# Patient Record
Sex: Female | Born: 1990
Health system: Southern US, Community
[De-identification: ages and names within clinical notes are randomized; demographics above are authoritative.]

## PROBLEM LIST (undated history)

## (undated) DIAGNOSIS — F32A Depression, unspecified: Secondary | ICD-10-CM

## (undated) DIAGNOSIS — G43909 Migraine, unspecified, not intractable, without status migrainosus: Secondary | ICD-10-CM

## (undated) DIAGNOSIS — F329 Major depressive disorder, single episode, unspecified: Secondary | ICD-10-CM

## (undated) DIAGNOSIS — E669 Obesity, unspecified: Secondary | ICD-10-CM

## (undated) DIAGNOSIS — N200 Calculus of kidney: Secondary | ICD-10-CM

## (undated) HISTORY — PX: OTHER SURGICAL HISTORY: SHX169

## (undated) HISTORY — PX: TONSILLECTOMY AND ADENOIDECTOMY: SHX28

## (undated) HISTORY — DX: Migraine, unspecified, not intractable, without status migrainosus: G43.909

## (undated) HISTORY — DX: Major depressive disorder, single episode, unspecified: F32.9

## (undated) HISTORY — DX: Calculus of kidney: N20.0

## (undated) HISTORY — DX: Obesity, unspecified: E66.9

## (undated) HISTORY — DX: Depression, unspecified: F32.A

## (undated) HISTORY — PX: WRIST SURGERY: SHX841

---
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
## 2010-02-04 IMAGING — CT CT ABD-PELV W/ CM
1 of 2 series · 15 of 32 positions shown, 19 images · non-contrast
Comparison: none

REASON FOR EXAM: (1) pain; (2) pain
COMMENTS:

[Series 2: appendicitis · axial · 0.82mm/px · z∈[-486,-30]mm · 15 of 166 slices shown, 19 images]
[im 7/166  soft-tissue]
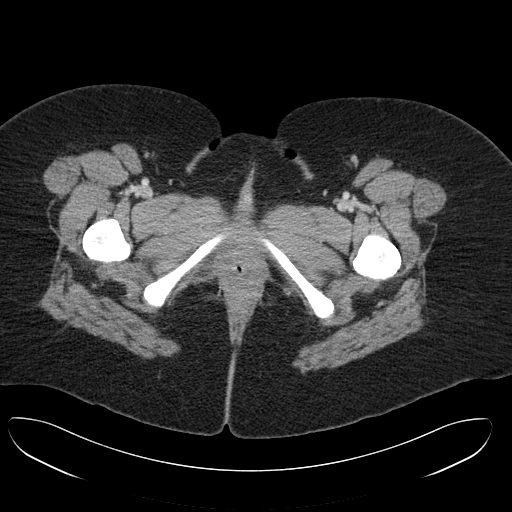
[im 7/166  bone]
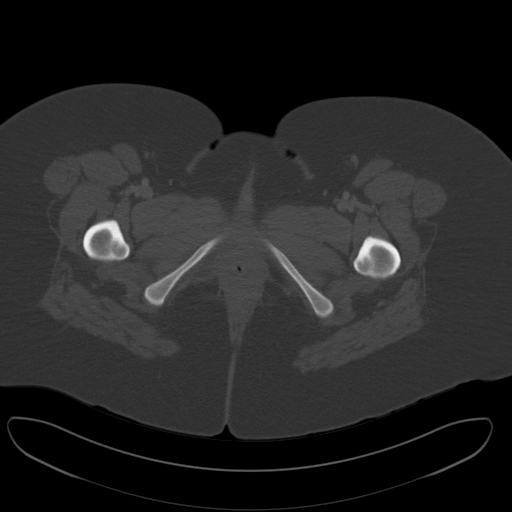
[im 20/166  soft-tissue]
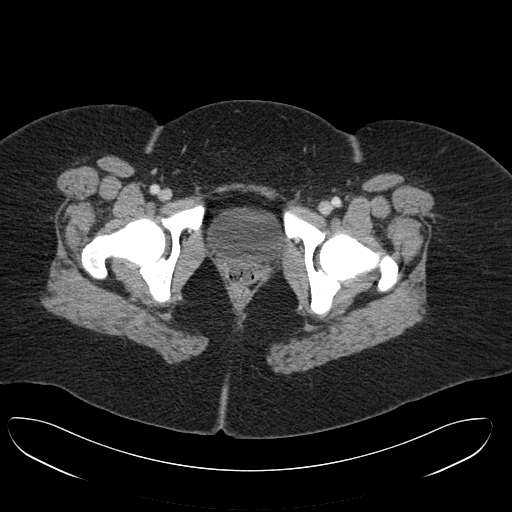
[im 32/166  soft-tissue]
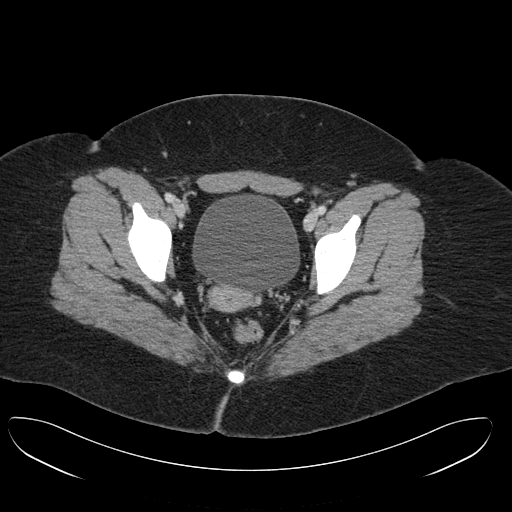
[im 45/166  soft-tissue]
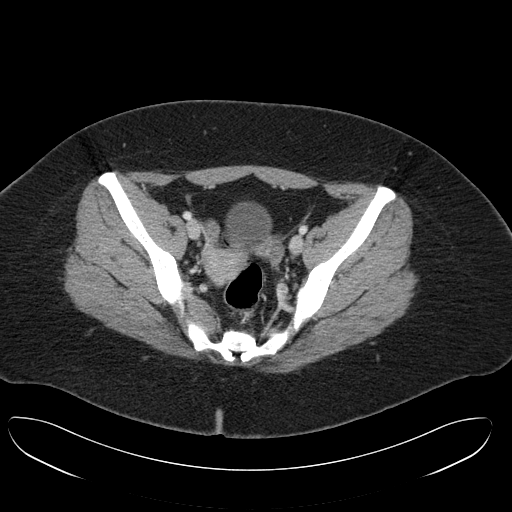
[im 58/166  soft-tissue]
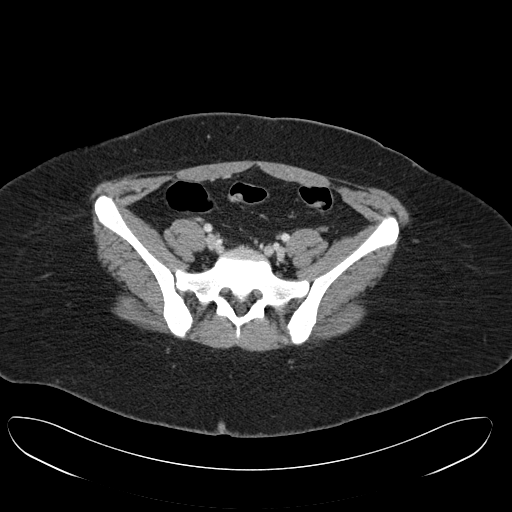
[im 70/166  soft-tissue]
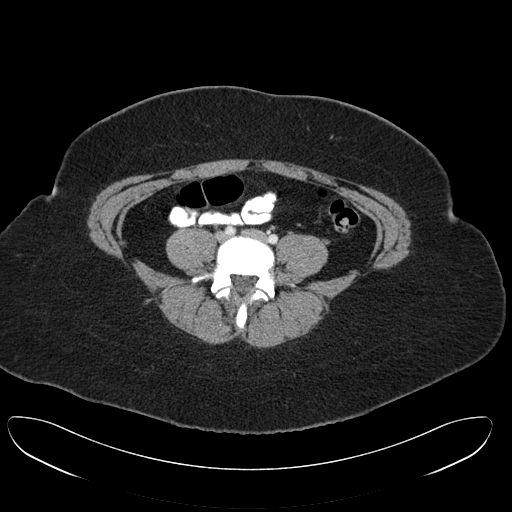
[im 83/166  soft-tissue]
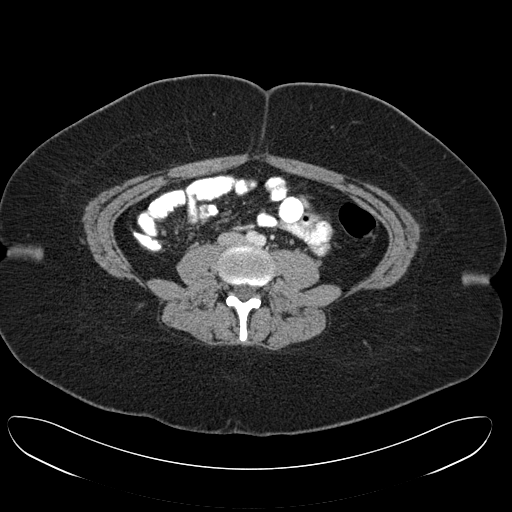
[im 96/166  soft-tissue]
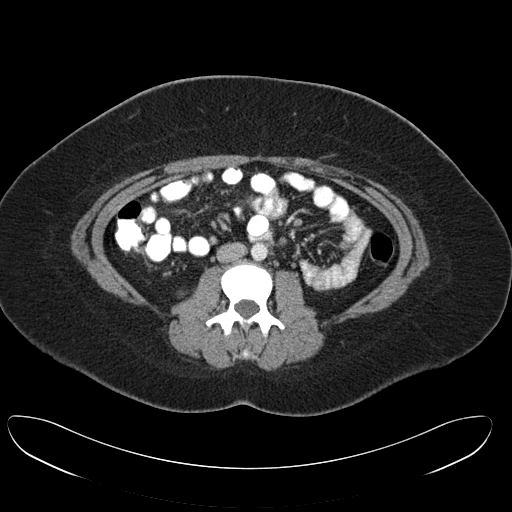
[im 108/166  soft-tissue]
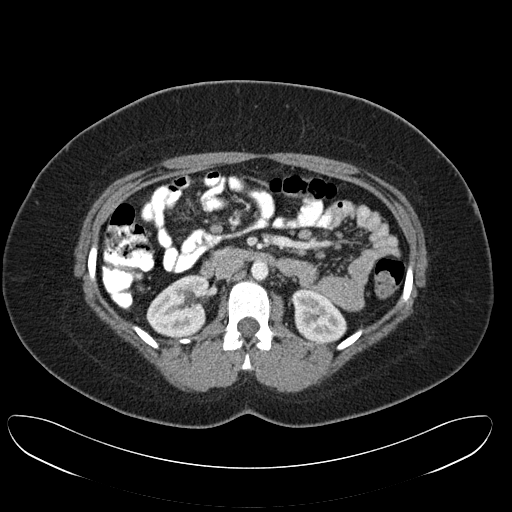
[im 108/166  bone]
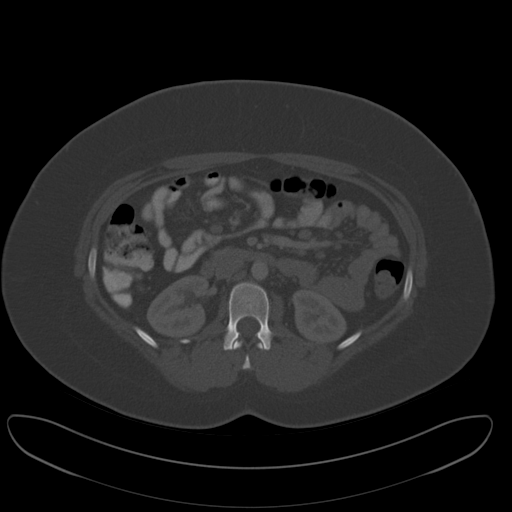
[im 121/166  soft-tissue]
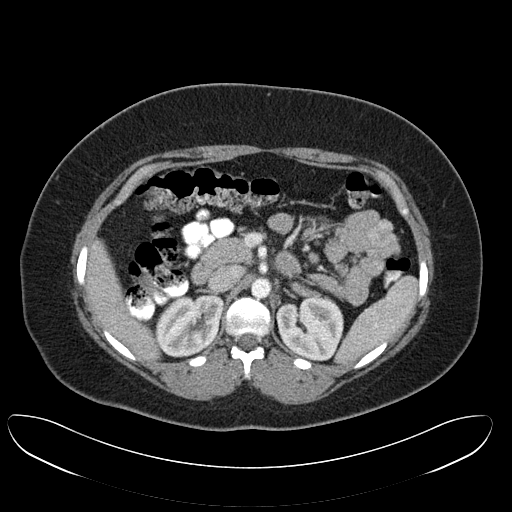
[im 134/166  soft-tissue]
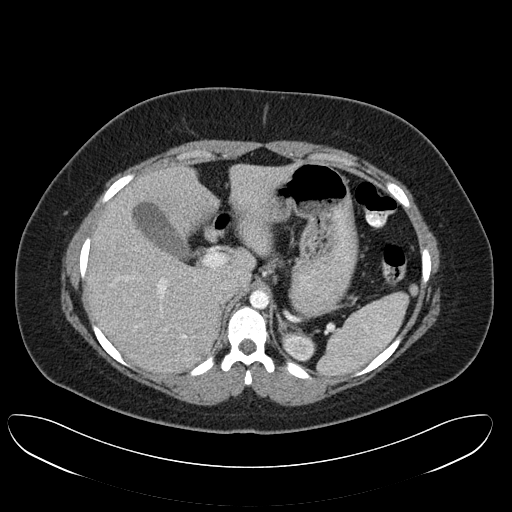
[im 140/166  lung]
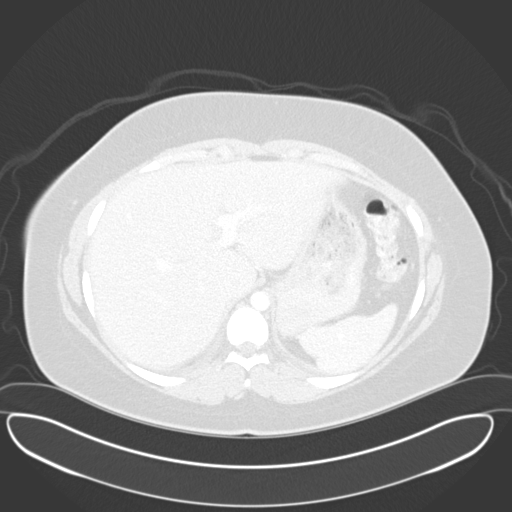
[im 146/166  soft-tissue]
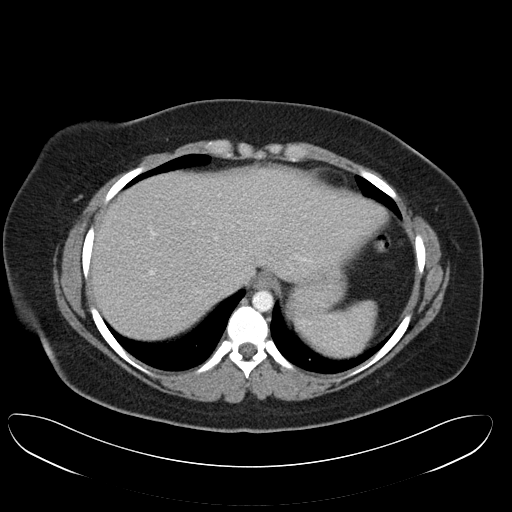
[im 146/166  lung]
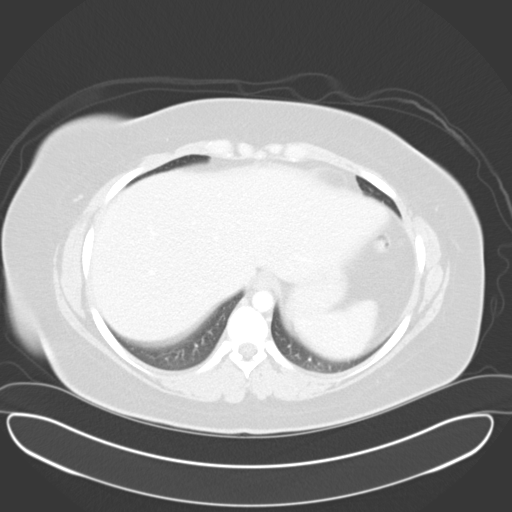
[im 153/166  lung]
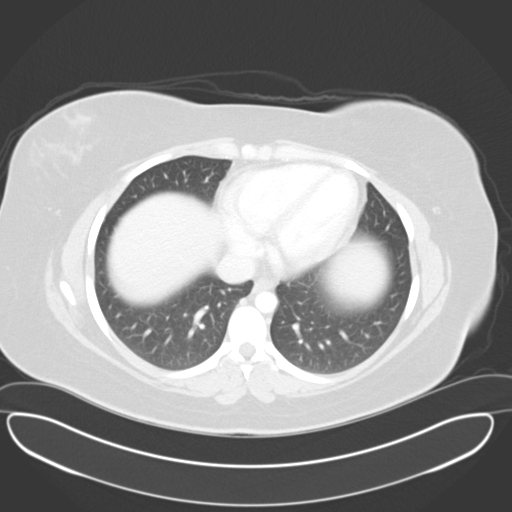
[im 159/166  soft-tissue]
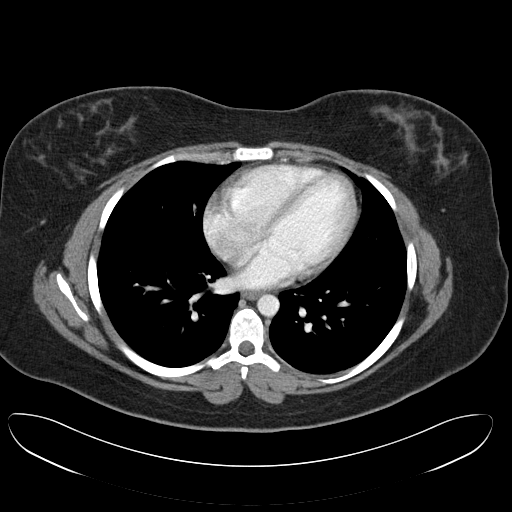
[im 159/166  lung]
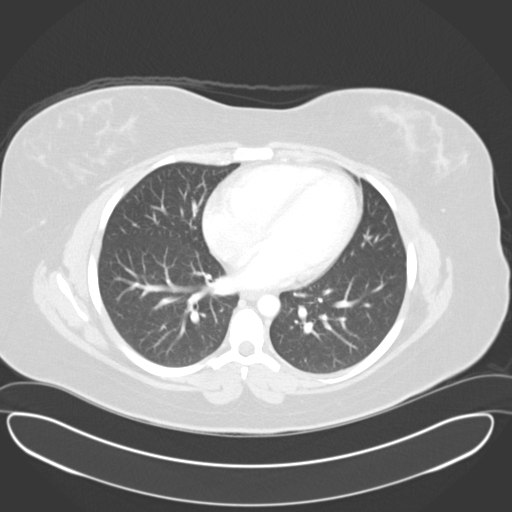

[15 of 32 positions shown; findings below may reference images not displayed]

PROCEDURE:     CT  - CT ABDOMEN / PELVIS  W  - May 12, 2008 [DATE]

RESULT:     Axial contrast enhanced CT scanning was performed through the
abdomen and pelvis. The patient received 100 cc of Jsovue-FC1 as well as
oral contrast material. Review of 3-dimensional reconstructed images was
performed separately on the [employer] monitor.

The liver exhibits normal density with no mass nor ductal dilation. The
gallbladder is adequately distended with no evidence of stones. The
pancreas, spleen, stomach, adrenal glands, and kidneys exhibit no acute
abnormality. The caliber of the abdominal aorta is normal.

The partially contrast-filled loops of small and large bowel are normal in
appearance. I see no findings suspicious for acute appendicitis or acute
diverticulitis. There are numerous in borderline enlarged mesenteric lymph
nodes present throughout the mesentery. Within the pelvis the uterus and
adnexal structures are within the limits of normal. The urinary bladder is
partially distended and grossly normal. No free fluid is identified. No
pelvic or inguinal lymphadenopathy is identified. The lung bases are clear.
The lumbar vertebral bodies are preserved in height.
IMPRESSION: 1. I do not see evidence of acute appendicitis or other acute bowel
abnormality.
2. There are numerous borderline to mildly enlarged mesenteric lymph nodes.
This may indicate mesenteric adenitis. No retroperitoneal lymphadenopathy is
identified.
3. There is no evidence of ascites.
4. I see no acute abnormality of the hepatobiliary tree nor of the urinary
tracts.

A preliminary report was sent to the [HOSPITAL] the conclusion
of the study the

## 2010-05-25 LAB — BASIC METABOLIC PANEL
BUN: 8 mg/dL (ref 6–23)
Chloride: 108 mEq/L (ref 96–112)
GFR calc non Af Amer: >60 mL/min (ref >60)
Glucose, Bld: 98 mg/dL (ref 70–99)
Potassium: 3.9 mEq/L (ref 3.5–5.1)
Sodium: 139 mEq/L (ref 135–145)

## 2010-05-25 LAB — DIFFERENTIAL
Eosinophils Absolute: 0.1 10*3/uL (ref 0.0–0.7)
Eosinophils Relative: 2 % (ref 0–5)
Lymphocytes Relative: 29 % (ref 12–46)
Lymphs Abs: 1.8 10*3/uL (ref 0.7–4.0)
Monocytes Absolute: 0.6 10*3/uL (ref 0.1–1.0)

## 2010-05-25 LAB — URINALYSIS, ROUTINE W REFLEX MICROSCOPIC
Glucose, UA: NEGATIVE mg/dL
Hgb urine dipstick: NEGATIVE
Ketones, ur: NEGATIVE mg/dL
Protein, ur: NEGATIVE mg/dL
pH: 7 (ref 5.0–8.0)

## 2010-05-25 LAB — WET PREP, GENITAL
WBC, Wet Prep HPF POC: NONE SEEN
Yeast Wet Prep HPF POC: NONE SEEN

## 2010-05-25 LAB — CBC
HCT: 43.1 % (ref 36.0–46.0)
Hemoglobin: 14.7 g/dL (ref 12.0–15.0)
MCV: 87.7 fL (ref 78.0–100.0)
Platelets: 265 10*3/uL (ref 150–400)
WBC: 6.2 10*3/uL (ref 4.0–10.5)

## 2010-05-25 LAB — GC/CHLAMYDIA PROBE AMP, GENITAL
Chlamydia, DNA Probe: NEGATIVE
GC Probe Amp, Genital: NEGATIVE

## 2010-06-24 ENCOUNTER — Emergency Department: Payer: Self-pay | Admitting: Emergency Medicine

## 2010-06-28 LAB — PATHOLOGY REPORT
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
## 2011-03-21 ENCOUNTER — Emergency Department: Payer: Self-pay | Admitting: Emergency Medicine

## 2011-11-07 ENCOUNTER — Observation Stay: Payer: Self-pay

## 2011-12-01 ENCOUNTER — Inpatient Hospital Stay: Payer: Self-pay

## 2011-12-01 ENCOUNTER — Observation Stay: Payer: Self-pay

## 2011-12-01 LAB — CBC WITH DIFFERENTIAL/PLATELET
Basophil %: 0.4 %
Eosinophil %: 0.3 %
HCT: 38.5 % (ref 35.0–47.0)
Lymphocyte #: 2.4 10*3/uL (ref 1.0–3.6)
MCV: 92 fL (ref 80–100)
Monocyte %: 4 %
Neutrophil #: 13.2 10*3/uL — ABNORMAL HIGH (ref 1.4–6.5)
RDW: 13.8 % (ref 11.5–14.5)
WBC: 16.4 10*3/uL — ABNORMAL HIGH (ref 3.6–11.0)

## 2011-12-03 LAB — HEMATOCRIT: HCT: 33.9 % — ABNORMAL LOW (ref 35.0–47.0)

## 2012-07-17 LAB — HM PAP SMEAR: HM PAP: NEGATIVE

## 2012-11-30 ENCOUNTER — Encounter (HOSPITAL_BASED_OUTPATIENT_CLINIC_OR_DEPARTMENT_OTHER): Payer: Self-pay | Admitting: Emergency Medicine

## 2012-11-30 ENCOUNTER — Emergency Department (HOSPITAL_BASED_OUTPATIENT_CLINIC_OR_DEPARTMENT_OTHER): Payer: 59

## 2012-11-30 ENCOUNTER — Emergency Department (HOSPITAL_BASED_OUTPATIENT_CLINIC_OR_DEPARTMENT_OTHER)
Admission: EM | Admit: 2012-11-30 | Discharge: 2012-11-30 | Disposition: A | Discharge status: Home / Self Care | Payer: 59 | Attending: Emergency Medicine | Admitting: Emergency Medicine

## 2012-11-30 DIAGNOSIS — X500XXA Overexertion from strenuous movement or load, initial encounter: Secondary | ICD-10-CM | POA: Insufficient documentation

## 2012-11-30 DIAGNOSIS — IMO0001 Reserved for inherently not codable concepts without codable children: Secondary | ICD-10-CM | POA: Insufficient documentation

## 2012-11-30 DIAGNOSIS — S93402A Sprain of unspecified ligament of left ankle, initial encounter: Secondary | ICD-10-CM

## 2012-11-30 DIAGNOSIS — Y9289 Other specified places as the place of occurrence of the external cause: Secondary | ICD-10-CM | POA: Insufficient documentation

## 2012-11-30 DIAGNOSIS — Y93E5 Activity, floor mopping and cleaning: Secondary | ICD-10-CM | POA: Insufficient documentation

## 2012-11-30 DIAGNOSIS — S93409A Sprain of unspecified ligament of unspecified ankle, initial encounter: Secondary | ICD-10-CM | POA: Insufficient documentation

## 2012-11-30 MED ORDER — IBUPROFEN 800 MG PO TABS: 800.0000 mg | ORAL_TABLET | Freq: Three times a day (TID) | ORAL | Status: DC

## 2012-11-30 NOTE — ED Notes (Signed)
Pt reports that she was cleaning up from a birthday party, stepped on a toy and twisted ankle,

## 2012-11-30 NOTE — ED Provider Notes (Signed)
Medical screening examination/treatment/procedure(s) were performed by non-physician practitioner and as supervising physician I was immediately available for consultation/collaboration.  Doug Sou, MD 11/30/12 [phone]

## 2012-11-30 NOTE — ED Provider Notes (Signed)
CSN: [id number]     Arrival date & time 11/30/12  2210 History   None    Chief Complaint  Patient presents with  . Ankle Injury   (Consider location/radiation/quality/duration/timing/severity/associated sxs/prior Treatment) Patient is a 22 y.o. female presenting with lower extremity injury. The history is provided by the patient. No language interpreter was used.  Ankle Injury This is a new problem. The current episode started today. The problem occurs constantly. The problem has been unchanged. Associated symptoms include myalgias. Nothing aggravates the symptoms. She has tried nothing for the symptoms. The treatment provided no relief.  Pt reports she twisted right ankle  History reviewed. No pertinent past medical history. History reviewed. No pertinent past surgical history. History reviewed. No pertinent family history. History  Substance Use Topics  . Smoking status: Never Smoker   . Smokeless tobacco: Never Used  . Alcohol Use: No   OB History   Grav Para Term Preterm Abortions TAB SAB Ect Mult Living                 Review of Systems  Musculoskeletal: Positive for myalgias.  All other systems reviewed and are negative.    Allergies  Review of patient's allergies indicates no known allergies.  Home Medications  No current outpatient prescriptions on file. BP 112/73  Pulse 91  Temp(Src) 98.1 F (36.7 C) (Oral)  Resp 18  Ht 5' 3.5" (1.613 m)  Wt 270 lb (122.471 kg)  BMI 47.07 kg/m2  SpO2 100%  LMP 11/23/2012 Physical Exam  Constitutional: She is oriented to person, place, and time. She appears well-developed and well-nourished.  HENT:  Head: Normocephalic.  Eyes: Pupils are equal, round, and reactive to light.  Musculoskeletal: She exhibits tenderness.  Swollen right foot and ankle  Neurological: She is alert and oriented to person, place, and time.  Skin: Skin is warm.  Psychiatric: She has a normal mood and affect.    ED Course  Procedures  (including critical care time) Labs Review Labs Reviewed - No data to display Imaging Review Dg Ankle Complete Right  11/30/2012   CLINICAL DATA:  Lateral foot and ankle pain after twisting injury.  EXAM: RIGHT ANKLE - COMPLETE 3+ VIEW  COMPARISON:  None.  FINDINGS: There is no evidence of fracture, dislocation, or joint effusion. There is no evidence of arthropathy or other focal bone abnormality. Soft tissues are unremarkable.  IMPRESSION: Negative.   Electronically Signed   By: Burman Nieves M.D.   On: 11/30/2012 23:03   Dg Foot Complete Right  11/30/2012   CLINICAL DATA:  Lateral foot and ankle pain after twisting injury.  EXAM: RIGHT FOOT COMPLETE - 3+ VIEW  COMPARISON:  None.  FINDINGS: There is no evidence of fracture or dislocation. There is no evidence of arthropathy or other focal bone abnormality. Soft tissues are unremarkable.  IMPRESSION: Negative.   Electronically Signed   By: Burman Nieves M.D.   On: 11/30/2012 23:03    EKG Interpretation   None       MDM   1. Ankle sprain, left, initial encounter    Aso, ibuprofen,  Follow up with Dr. Allena Napoleon, PA-C 11/30/12 2319  Lonia Skinner Colon, PA-C 11/30/12 2320  Lonia Skinner [place], [place] 11/30/12 2320

## 2014-06-22 NOTE — H&P (Signed)
L&D Evaluation:  History:   HPI 24 yo [facility]2P0 [facility] presents to L&D for contractions starting yesterday morning, icreasing in frequency, and keeping her awake overnight prior to presentation.  [facility] and [facility]. Fetus with small choroid plexus cyst resolved on follow up scan.  Most recent US based EFW at 38 weeks 3364g 60%ile, AFI 20cm.    Presents with contractions    Patient's Medical History hx depression    Patient's Surgical History tonsillectomy    Medications Pre Natal Vitamins  Zoloft 100mg  po daily, ASA 81mg  daily    Allergies NKDA    Social History none    Family History Non-Contributory   ROS:   ROS All systems were reviewed.  HEENT, CNS, GI, GU, Respiratory, CV, Renal and Musculoskeletal systems were found to be normal.   Exam:   Vital Signs stable    Urine Protein not completed    General no apparent distress    Mental Status clear    Abdomen gravid, non-tender    Estimated Fetal Weight Average for gestational age    Back no CVAT    Edema no edema    Pelvic 50/2/-3    Mebranes Intact    FHT normal rate with no decels    Ucx regular, 4-686min   Impression:   Impression early labor   Plan:   Plan EFM/NST, monitor contractions and for cervical change    Comments - Will give patient prolonged rule out recheck in 4-hr after presentation to see if any cervical change   Electronic Signatures for Addendum Section:  Lorrene ReidStaebler, Porsche Noguchi M (MD) (Signed Addendum 19-Oct-13 11:37)  Cervix unchanged 2/50/-2 anterior and soft.  Rx written for ambien for tongiht, routine labor precautions.  Out of social reasons will consider IOL tomorrow when I am on if L&D is not to busy, otherwise put on for 12/06/11   Electronic Signatures: Lorrene ReidStaebler, Herberto Ledwell M (MD)  (Signed 19-Oct-13 11:12)  Authored: L&D Evaluation   Last Updated: 19-Oct-13 11:37 by Lorrene ReidStaebler, Debhora Titus M (MD)

## 2014-06-22 NOTE — H&P (Signed)
L&D Evaluation:  History:   HPI 24 yo G2P0 presents to L&D for decreased FM at 36 weeks. [facility]. No signifant events during pregnancy.    Presents with decreased fetal movement    Patient's Medical History hx depression    Patient's Surgical History tonsillectomy    Medications Pre Natal Vitamins    Allergies NKDA    Social History none    Family History Non-Contributory   ROS:   ROS All systems were reviewed.  HEENT, CNS, GI, GU, Respiratory, CV, Renal and Musculoskeletal systems were found to be normal.   Exam:   Vital Signs stable    Urine Protein not completed    General no apparent distress    Mental Status clear    Abdomen gravid, non-tender    Estimated Fetal Weight Average for gestational age    Back no CVAT    Edema no edema    Mebranes Intact    FHT normal rate with no decels    Ucx irritabiity   Impression:   Impression decreased fetal movement, reactive NST   Plan:   Plan EFM/NST, discharge    Comments good FM noted here, pt feeling now. has appt at office in 5 days   Electronic Signatures: Shella MaximPutnam, Glenette Bookwalter (CNM)  (Signed 25-Sep-13 21:03)  Authored: L&D Evaluation   Last Updated: 25-Sep-13 21:03 by Shella MaximPutnam, Chirstine Defrain (CNM)

## 2014-06-22 NOTE — H&P (Signed)
L&D Evaluation:  History:   HPI 24 yo G2P0 [facility]8wk [facility]) presents to L&D second time today for contractions starting yesterday morning, icreasing in frequency, and keeping her awake overnight prior to presentation.  [facility] and [facility]. Fetus with small choroid plexus cyst resolved on follow up scan.  Most recent US based EFW at 38 weeks 3364g 60%ile, AFI 20cm.  During the time of her second rule out the patient underwent gross SROM with meconium stained fluid noted.    Presents with contractions    Patient's Medical History hx depression, morbid obesity    Patient's Surgical History tonsillectomy    Medications Pre Natal Vitamins  Zoloft 100mg  po daily, ASA 81mg  daily    Allergies NKDA    Social History none    Family History Non-Contributory   ROS:   ROS All systems were reviewed.  HEENT, CNS, GI, GU, Respiratory, CV, Renal and Musculoskeletal systems were found to be normal.   Exam:   Vital Signs stable    Urine Protein not completed    General no apparent distress    Mental Status clear    Abdomen gravid, non-tender    Estimated Fetal Weight Average for gestational age    Back no CVAT    Edema no edema    Pelvic 4/C/-2    Mebranes Ruptured    Description green/meconium    FHT normal rate with no decels    Ucx regular, 4-226min   Impression:   Impression active labor, 7130w3d early labor, SROM   Plan:   Plan EFM/NST, monitor contractions and for cervical change    Comments - GBS negative - Admit for term labor/SROM   Electronic Signatures: Lorrene ReidStaebler, Lama Narayanan M (MD)  (Signed 19-Oct-13 22:13)  Authored: L&D Evaluation   Last Updated: 19-Oct-13 22:13 by Lorrene ReidStaebler, Nabilah Davoli M (MD)

## 2015-06-08 ENCOUNTER — Observation Stay (HOSPITAL_COMMUNITY)
Admission: EM | Admit: 2015-06-08 | Discharge: 2015-06-10 | Disposition: A | Discharge status: Home / Self Care | Payer: Medicaid Other | Attending: Internal Medicine | Admitting: Internal Medicine

## 2015-06-08 ENCOUNTER — Emergency Department (HOSPITAL_COMMUNITY): Payer: Medicaid Other

## 2015-06-08 DIAGNOSIS — R29810 Facial weakness: Secondary | ICD-10-CM | POA: Insufficient documentation

## 2015-06-08 DIAGNOSIS — R51 Headache: Secondary | ICD-10-CM | POA: Insufficient documentation

## 2015-06-08 DIAGNOSIS — R4689 Other symptoms and signs involving appearance and behavior: Secondary | ICD-10-CM | POA: Insufficient documentation

## 2015-06-08 DIAGNOSIS — R531 Weakness: Secondary | ICD-10-CM | POA: Diagnosis not present

## 2015-06-08 DIAGNOSIS — R4781 Slurred speech: Secondary | ICD-10-CM | POA: Insufficient documentation

## 2015-06-08 DIAGNOSIS — R4701 Aphasia: Secondary | ICD-10-CM | POA: Insufficient documentation

## 2015-06-08 DIAGNOSIS — R509 Fever, unspecified: Secondary | ICD-10-CM

## 2015-06-08 DIAGNOSIS — R32 Unspecified urinary incontinence: Secondary | ICD-10-CM | POA: Insufficient documentation

## 2015-06-08 LAB — I-STAT CHEM 8, ED
BUN: 21 mg/dL — AB (ref 6–20)
CHLORIDE: 102 mmol/L (ref 101–111)
Calcium, Ion: 1.14 mmol/L (ref 1.12–1.23)
Creatinine, Ser: 1 mg/dL (ref 0.44–1.00)
Glucose, Bld: 103 mg/dL — ABNORMAL HIGH (ref 65–99)
HEMATOCRIT: 43 % (ref 36.0–46.0)
Hemoglobin: 14.6 g/dL (ref 12.0–15.0)
Potassium: 3.9 mmol/L (ref 3.5–5.1)
SODIUM: 141 mmol/L (ref 135–145)
TCO2: 26 mmol/L (ref 0–100)

## 2015-06-08 LAB — CBC
HEMATOCRIT: 40.6 % (ref 36.0–46.0)
HEMOGLOBIN: 14 g/dL (ref 12.0–15.0)
MCH: 30.2 pg (ref 26.0–34.0)
MCHC: 34.5 g/dL (ref 30.0–36.0)
MCV: 87.5 fL (ref 78.0–100.0)
Platelets: 239 10*3/uL (ref 150–400)
RBC: 4.64 MIL/uL (ref 3.87–5.11)
RDW: 12.8 % (ref 11.5–15.5)
WBC: 10.3 10*3/uL (ref 4.0–10.5)

## 2015-06-08 LAB — DIFFERENTIAL
Basophils Absolute: 0 10*3/uL (ref 0.0–0.1)
Basophils Relative: 0 %
EOS PCT: 1 %
Eosinophils Absolute: 0.1 10*3/uL (ref 0.0–0.7)
LYMPHS ABS: 4.8 10*3/uL — AB (ref 0.7–4.0)
LYMPHS PCT: 47 %
MONO ABS: 0.6 10*3/uL (ref 0.1–1.0)
MONOS PCT: 6 %
Neutro Abs: 4.8 10*3/uL (ref 1.7–7.7)
Neutrophils Relative %: 47 %

## 2015-06-08 NOTE — ED Provider Notes (Signed)
CSN: [id number]     Arrival date & time 06/08/15  2340 History   By signing my name below, I, Arlan Organ, attest that this documentation has been prepared under the direction and in the presence of Gilda Crease, MD.  Electronically Signed: Arlan Organ, ED Scribe. 06/08/2015. 12:06 AM.   No chief complaint on file.  The history is provided by the patient. No language interpreter was used.    HPI Comments: Christine Hooper is a 25 y.o. female without any pertinent past medical history who presents to the Emergency Department here for a possible code stroke this evening. Pt states she was at the gym earlier this evening and did some walking afterwards. She then noted mild weakness after working out for a short period of time. At approximately 22:15, pt states she experienced L leg weakness, urinary incontinence, nausea, slurred speech, and facial droop. Pt states she was aware during incident but states she was unable to speak. These symptoms have now resolved, however, she now reports a constant, ongoing HA. Pt states HA has been intermittent for 2 days which she has been managing with OTC Advil. No recent fever, chills, vomiting, chest pain, or shortness of breath. No known allergies to medicaitons.  PCP: Raquel James, MD    No past medical history on file. No past surgical history on file. No family history on file. Social History  Substance Use Topics  . Smoking status: Never Smoker   . Smokeless tobacco: Never Used  . Alcohol Use: No   OB History    No data available     Review of Systems  Constitutional: Negative for fever and chills.  Respiratory: Negative for cough and shortness of breath.   Cardiovascular: Negative for chest pain.  Gastrointestinal: Positive for nausea. Negative for vomiting.  Neurological: Positive for facial asymmetry, speech difficulty, weakness, numbness and headaches.  Psychiatric/Behavioral: Negative for confusion.  All other systems reviewed  and are negative.     Allergies  Review of patient's allergies indicates no known allergies.  Home Medications   Prior to Admission medications   Medication Sig Start Date End Date Taking? Authorizing Provider  ibuprofen (ADVIL,MOTRIN) 800 MG tablet Take 1 tablet (800 mg total) by mouth 3 (three) times daily. 11/30/12   Elson Areas, PA-C   Triage Vitals: BP 114/52 mmHg  Pulse 85  Temp(Src) 98 F (36.7 C) (Oral)  Resp 33  Ht  (1.651 m)  Wt 278 lb (126.1 kg)  BMI 46.26 kg/m2  SpO2 100%   Physical Exam  Constitutional: She is oriented to person, place, and time. She appears well-developed and well-nourished. No distress.  HENT:  Head: Normocephalic and atraumatic.  Right Ear: Hearing normal.  Left Ear: Hearing normal.  Nose: Nose normal.  Mouth/Throat: Oropharynx is clear and moist and mucous membranes are normal.  Eyes: Conjunctivae and EOM are normal. Pupils are equal, round, and reactive to light.  Neck: Normal range of motion. Neck supple.  Cardiovascular: Regular rhythm, S1 normal and S2 normal.  Exam reveals no gallop and no friction rub.   No murmur heard. Pulmonary/Chest: Effort normal and breath sounds normal. No respiratory distress. She exhibits no tenderness.  Abdominal: Soft. Normal appearance and bowel sounds are normal. There is no hepatosplenomegaly. There is no tenderness. There is no rebound, no guarding, no tenderness at McBurney's point and negative Murphy's sign. No hernia.  Musculoskeletal: Normal range of motion.  Neurological: She is alert and oriented to person, place,  and time. She has normal strength. No cranial nerve deficit or sensory deficit. Coordination normal. GCS eye subscore is 4. GCS verbal subscore is 5. GCS motor subscore is 6.  Skin: Skin is warm, dry and intact. No rash noted. No cyanosis.  Psychiatric: She has a normal mood and affect. Her speech is normal and behavior is normal. Thought content normal.  Nursing note and vitals  reviewed.   ED Course  Procedures (including critical care time)  DIAGNOSTIC STUDIES: Oxygen Saturation is 100% on RA, Normal by my interpretation.    COORDINATION OF CARE: 11:57 PM- Will order blood work, imaging, urinalysis, and EKG. Discussed treatment plan with pt at bedside and pt agreed to plan.     Labs Review Labs Reviewed  I-STAT CHEM 8, ED - Abnormal; Notable for the following:    BUN 21 (*)    Glucose, Bld 103 (*)    All other components within normal limits  ETHANOL  PROTIME-INR  APTT  CBC  DIFFERENTIAL  COMPREHENSIVE METABOLIC PANEL  URINE RAPID DRUG SCREEN, HOSP PERFORMED  URINALYSIS, ROUTINE W REFLEX MICROSCOPIC (NOT AT [facility])  I-STAT TROPOININ, ED    Imaging Review No results found. I have personally reviewed and evaluated these images and lab results as part of my medical decision-making.   EKG Interpretation None      MDM   Final diagnoses:  None  TIA  Patient presents to the emergency department for evaluation as a code stroke. Patient brought from home after acute onset of left-sided weakness. Patient reportedly had sudden onset of slurred speech, facial drooping while at home. Mother reported that it appeared like the left side of her body was clenched initially and then she had trouble moving the left side. Patient was incontinent of urine. Patient now complaining of 7 out of 10 headache. She has been experiencing intermittent headache for 1 week that has been improving but not completely resolving with NSAIDs. She does not have a history of migraines.  Patient was seen by neurology. Her initial screening CT scan did not show any acute abnormality. Workup is unremarkable thus far. Patient continues have headache, but her symptoms of weakness, facial drooping and slurred speech have completely resolved. She has no neurologic deficit noted on examination. Neurology has recommended admission for further workup of TIA versus complex migraine versus  seizure.  I personally performed the services described in this documentation, which was scribed in my presence. The recorded information has been reviewed and is accurate.    Gilda Creasehristopher J Pollina, MD 06/09/15 0040

## 2015-06-09 ENCOUNTER — Observation Stay (HOSPITAL_COMMUNITY)
Admit: 2015-06-09 | Discharge: 2015-06-09 | Disposition: A | Discharge status: Home / Self Care | Payer: Medicaid Other | Attending: Family Medicine | Admitting: Family Medicine

## 2015-06-09 ENCOUNTER — Encounter (HOSPITAL_COMMUNITY): Payer: Self-pay | Admitting: Emergency Medicine

## 2015-06-09 ENCOUNTER — Observation Stay (HOSPITAL_BASED_OUTPATIENT_CLINIC_OR_DEPARTMENT_OTHER): Payer: Medicaid Other

## 2015-06-09 ENCOUNTER — Observation Stay (HOSPITAL_COMMUNITY): Payer: Medicaid Other

## 2015-06-09 DIAGNOSIS — G459 Transient cerebral ischemic attack, unspecified: Secondary | ICD-10-CM

## 2015-06-09 DIAGNOSIS — M6289 Other specified disorders of muscle: Secondary | ICD-10-CM | POA: Diagnosis not present

## 2015-06-09 DIAGNOSIS — R4182 Altered mental status, unspecified: Secondary | ICD-10-CM

## 2015-06-09 DIAGNOSIS — R531 Weakness: Secondary | ICD-10-CM | POA: Diagnosis present

## 2015-06-09 LAB — URINALYSIS, ROUTINE W REFLEX MICROSCOPIC
BILIRUBIN URINE: NEGATIVE
Glucose, UA: NEGATIVE mg/dL
Hgb urine dipstick: NEGATIVE
KETONES UR: 15 mg/dL — AB
LEUKOCYTES UA: NEGATIVE
NITRITE: NEGATIVE
Protein, ur: NEGATIVE mg/dL
SPECIFIC GRAVITY, URINE: 1.015 (ref 1.005–1.030)
pH: 7 (ref 5.0–8.0)

## 2015-06-09 LAB — CBC
HEMATOCRIT: 40.2 % (ref 36.0–46.0)
HEMOGLOBIN: 13.8 g/dL (ref 12.0–15.0)
MCH: 29.8 pg (ref 26.0–34.0)
MCHC: 34.3 g/dL (ref 30.0–36.0)
MCV: 86.8 fL (ref 78.0–100.0)
Platelets: 258 10*3/uL (ref 150–400)
RBC: 4.63 MIL/uL (ref 3.87–5.11)
RDW: 12.7 % (ref 11.5–15.5)
WBC: 9.9 10*3/uL (ref 4.0–10.5)

## 2015-06-09 LAB — ECHOCARDIOGRAM COMPLETE
Height: 65 in
WEIGHTICAEL: 4448 [oz_av]

## 2015-06-09 LAB — I-STAT TROPONIN, ED: Troponin i, poc: 0 ng/mL (ref 0.00–0.08)

## 2015-06-09 LAB — BASIC METABOLIC PANEL
ANION GAP: 10 (ref 5–15)
BUN: 17 mg/dL (ref 6–20)
CALCIUM: 9 mg/dL (ref 8.9–10.3)
CHLORIDE: 107 mmol/L (ref 101–111)
CO2: 22 mmol/L (ref 22–32)
Creatinine, Ser: 0.85 mg/dL (ref 0.44–1.00)
GFR calc Af Amer: >60 mL/min (ref >60)
GFR calc non Af Amer: >60 mL/min (ref >60)
GLUCOSE: 123 mg/dL — AB (ref 65–99)
Potassium: 4.4 mmol/L (ref 3.5–5.1)
Sodium: 139 mmol/L (ref 135–145)

## 2015-06-09 LAB — COMPREHENSIVE METABOLIC PANEL
ALK PHOS: 39 U/L (ref 38–126)
ALT: 25 U/L (ref 14–54)
ANION GAP: 10 (ref 5–15)
AST: 19 U/L (ref 15–41)
Albumin: 3.9 g/dL (ref 3.5–5.0)
BILIRUBIN TOTAL: 1 mg/dL (ref 0.3–1.2)
BUN: 19 mg/dL (ref 6–20)
CALCIUM: 9.6 mg/dL (ref 8.9–10.3)
CO2: 25 mmol/L (ref 22–32)
CREATININE: 1.04 mg/dL — AB (ref 0.44–1.00)
Chloride: 105 mmol/L (ref 101–111)
Glucose, Bld: 108 mg/dL — ABNORMAL HIGH (ref 65–99)
Potassium: 3.9 mmol/L (ref 3.5–5.1)
SODIUM: 140 mmol/L (ref 135–145)
TOTAL PROTEIN: 6.6 g/dL (ref 6.5–8.1)

## 2015-06-09 LAB — LIPID PANEL
CHOL/HDL RATIO: 3.5 ratio
Cholesterol: 114 mg/dL (ref 0–200)
HDL: 33 mg/dL — AB (ref >40)
LDL CALC: 74 mg/dL (ref 0–99)
Triglycerides: 36 mg/dL (ref <150)
VLDL: 7 mg/dL (ref 0–40)

## 2015-06-09 LAB — RAPID URINE DRUG SCREEN, HOSP PERFORMED
AMPHETAMINES: NOT DETECTED
Barbiturates: NOT DETECTED
Benzodiazepines: NOT DETECTED
Cocaine: NOT DETECTED
OPIATES: NOT DETECTED
TETRAHYDROCANNABINOL: NOT DETECTED

## 2015-06-09 LAB — APTT: aPTT: 30 seconds (ref 24–37)

## 2015-06-09 LAB — CBG MONITORING, ED: GLUCOSE-CAPILLARY: 97 mg/dL (ref 65–99)

## 2015-06-09 LAB — ETHANOL

## 2015-06-09 LAB — PROTIME-INR
INR: 1.13 (ref 0.00–1.49)
PROTHROMBIN TIME: 14.7 s (ref 11.6–15.2)

## 2015-06-09 MED ORDER — ASPIRIN 325 MG PO TABS
325.0000 mg | ORAL_TABLET | Freq: Every day | ORAL | Status: DC
  Administered 2015-06-09 – 2015-06-10 (×2): 325 mg via ORAL
  Filled 2015-06-09 (×2): qty 1

## 2015-06-09 MED ORDER — ACETAMINOPHEN 650 MG RE SUPP: 650.0000 mg | Freq: Four times a day (QID) | RECTAL | Status: DC | PRN

## 2015-06-09 MED ORDER — ENOXAPARIN SODIUM 60 MG/0.6ML ~~LOC~~ SOLN
60.0000 mg | SUBCUTANEOUS | Status: DC
  Administered 2015-06-09: 60 mg via SUBCUTANEOUS
  Filled 2015-06-09 (×2): qty 0.6

## 2015-06-09 MED ORDER — ACETAMINOPHEN 325 MG PO TABS
650.0000 mg | ORAL_TABLET | Freq: Four times a day (QID) | ORAL | Status: DC | PRN
  Administered 2015-06-09 – 2015-06-10 (×3): 650 mg via ORAL
  Filled 2015-06-09 (×3): qty 2

## 2015-06-09 MED ORDER — SODIUM CHLORIDE 0.9% FLUSH
3.0000 mL | Freq: Two times a day (BID) | INTRAVENOUS | Status: DC
  Administered 2015-06-09 – 2015-06-10 (×4): 3 mL via INTRAVENOUS

## 2015-06-09 MED ORDER — IOPAMIDOL (ISOVUE-370) INJECTION 76%
100.0000 mL | Freq: Once | INTRAVENOUS | Status: AC | PRN
  Administered 2015-06-09: 100 mL via INTRAVENOUS

## 2015-06-09 MED ORDER — PROCHLORPERAZINE EDISYLATE 5 MG/ML IJ SOLN
10.0000 mg | Freq: Once | INTRAMUSCULAR | Status: AC
  Administered 2015-06-09: 10 mg via INTRAVENOUS
  Filled 2015-06-09: qty 2

## 2015-06-09 MED ORDER — DIPHENHYDRAMINE HCL 50 MG/ML IJ SOLN
25.0000 mg | Freq: Once | INTRAMUSCULAR | Status: AC
  Administered 2015-06-09: 25 mg via INTRAVENOUS
  Filled 2015-06-09: qty 1

## 2015-06-09 MED ORDER — DOCUSATE SODIUM 100 MG PO CAPS
100.0000 mg | ORAL_CAPSULE | Freq: Two times a day (BID) | ORAL | Status: DC
  Administered 2015-06-09: 100 mg via ORAL
  Filled 2015-06-09 (×3): qty 1

## 2015-06-09 NOTE — ED Notes (Signed)
Pt in EMS from home, around 2215 pt became incontinent, slurred speech, L arm and L leg numbess/weakness, facial droop. Has had migraine since Sunday. All S/S resolved by time EMS arrived. Now reports HA in back of head, behind eyes. Pt A/OX4. CBG 119

## 2015-06-09 NOTE — Progress Notes (Signed)
VASCULAR LAB PRELIMINARY  PRELIMINARY  PRELIMINARY  PRELIMINARY  Carotid duplex completed.    Preliminary report:  No obvious evidence of ICA stenosis.  Vertebral artery flow is antegrade.   Kori Goins, RVT 06/09/2015, 12:04 PM

## 2015-06-09 NOTE — Progress Notes (Signed)
Patient arrived around 0300 alert oriented but tired all symptoms have resolved BP is soft and pulse is slightly elevated oxygen saturation level is stable in upper 90's on room air, SCD's ordered. Patient is complaining of head ache 6 on a 0-10 scale gave her tylenol will continue to monitor.

## 2015-06-09 NOTE — Progress Notes (Signed)
Subjective: Patient admitted this morning, see detailed H&P by Dr Haroldine Lawsrossley 25 year old female who has been working out for weight loss the past 4 weeks. Family denies any use of weight loss drugs. Today after working out she went home and ate dinner. She attempted to sit up and immediately sat back down and stating ,"I do not feel right". Her mother noted her left hand was clenching and the patient developed slurred and garbled speech. She tried to stand and fell across the table. She became incontinent of urine. Her mom was present and checked her blood pressure was 182/100 with pulse of 94 and oxygen saturation of 98%. They attempted to get her to smile and her smile was asymmetrical. By time 911 arrived the patient's speech was completely incomprehensible.  This morning left side weakness has completely resolved. She has no slurred speech. Work up for seizure and stroke underway. Had temp of 100.5 this morning. Filed Vitals:   06/09/15 0500 06/09/15 0700  BP: 116/51 108/45  Pulse: 85 104  Temp: 98.9 F (37.2 C) 100.5 F (38.1 C)  Resp: 16 16    Chest: Clear Bilaterally Heart : S1S2 RRR Abdomen: Soft, nontender Ext : No edema Neuro: Alert, oriented x 3 Neuro : No focal deficit  A/P  Left-sided weakness  Urinary incontinence ? Seizure  Fever  Carotid duplex showed no significant ICA stenosis Echocardiogram showed no PFO, no atrial thrombus EEG was normal MRI negative for stroke  Patient field this morning, UA is clear, Check chest x-ray today Check blood cultures 2    Meredeth IdeGagan S Lama [facility] [phone]- [phone]

## 2015-06-09 NOTE — Progress Notes (Signed)
  Echocardiogram 2D Echocardiogram has been performed.  Tye SavoyCasey N Tanelle Lanzo 06/09/2015, 12:21 PM

## 2015-06-09 NOTE — Procedures (Signed)
History: 25 yo F with transient alteration in awareness.   Sedation: None  Technique: This is a 21 channel routine scalp EEG performed at the bedside with bipolar and monopolar montages arranged in accordance to the international 10/20 system of electrode placement. One channel was dedicated to EKG recording.    Background: The background consists of intermixed alpha and beta activities. There is a well defined posterior dominant rhythm of 9 Hz that attenuates with eye opening. Sleep is recorded with normal appearing structures.   Photic stimulation: Physiologic driving is not performed  EEG Abnormalities: None  Clinical Interpretation: This normal EEG is recorded in the waking and sleep state. There was no seizure or seizure predisposition recorded on this study. Please note that a normal EEG does not preclude the possibility of epilepsy.   Ritta SlotMcNeill Ladarren Steiner, MD [facility] [phone]  If 7pm- 7am, please page neurology on call as listed in [facility].

## 2015-06-09 NOTE — Progress Notes (Signed)
EEG completed; results pending.    

## 2015-06-09 NOTE — H&P (Signed)
PCP:   Raquel James, MD   Chief Complaint:  Slurred speech   HPI: This is a 25 year old female who has been working out for weight loss the past 4 weeks. Family denies any use of weight loss drugs. Today after working out she went home and ate dinner. She attempted to sit up and immediately sat back down and stating ,"I do not feel right". Her mother noted her left hand was clenching and the patient developed slurred and garbled speech. She tried to stand and fell across the table. She became incontinent of urine. Her mom was present and checked her blood pressure was 182/100 with pulse of 94 and oxygen saturation of 98%. They attempted to get her to smile and her smile was asymmetrical. By time 911 arrived the patient's speech was completely incomprehensible. She then called her mom Haynes Kerns and her son her cousin. The patient has complained of a headache since Sunday. She doesn't have a history of migraine but there is a significant family history of migraine including her mom and her grandmother. During my interview the patient was clear and oriented. No evidence of slurred speech. Most history provided by the patient's mother who was present at bedside.  Review of Systems:  The patient denies anorexia, fever, weight loss,, vision loss, decreased hearing, hoarseness, chest pain, syncope, dyspnea on exertion, peripheral edema, balance deficits, hemoptysis, abdominal pain, melena, hematochezia, severe indigestion/heartburn, hematuria, incontinence, genital sores, muscle weakness, slurred speech, incontinence,suspicious skin lesions, transient blindness, difficulty walking, depression, unusual weight change, abnormal bleeding, enlarged lymph nodes, angioedema, and breast masses.  Past Medical History: History reviewed. No pertinent past medical history. Past Surgical History  Procedure Laterality Date  . Tonsillectomy      Medications: Prior to Admission medications   Medication Sig Start  Date End Date Taking? Authorizing Provider  levonorgestrel (MIRENA) 20 MCG/24HR IUD 1 each by Intrauterine route once.   Yes Historical Provider, MD    Allergies:  No Known Allergies  Social History:  reports that she has never smoked. She has never used smokeless tobacco. She reports that she drinks alcohol. She reports that she does not use illicit drugs.  Family History: No family history on file.  Physical Exam: Filed Vitals:   06/09/15 0000 06/09/15 0002 06/09/15 0015 06/09/15 0024  BP: 114/52 114/52 119/61   Pulse: 78 85 78   Temp:  98 F (36.7 C)  98.1 F (36.7 C)  TempSrc:  Oral    Resp: 27 33 32   Height:   (1.651 m)    Weight:  126.1 kg (278 lb)    SpO2: 100% 100% 100%     General:  Alert and oriented times three, well developed and nourished, no acute distress, tired Eyes: PERRLA, pink conjunctiva, no scleral icterus ENT: Moist oral mucosa, neck supple, no thyromegaly Lungs: clear to ascultation, no wheeze, no crackles, no use of accessory muscles Cardiovascular: regular rate and rhythm, no regurgitation, no gallops, no murmurs. No carotid bruits, no JVD Abdomen: soft, positive BS, non-tender, non-distended, no organomegaly, not an acute abdomen GU: not examined Neuro: CN II - XII grossly intact, sensation intact Musculoskeletal: strength 5/5 all extremities, no clubbing, cyanosis or edema Skin: no rash, no subcutaneous crepitation, no decubitus Psych: appropriate patient   Labs on Admission:   Recent Labs  06/08/15 2344 06/08/15 2352  NA 140 141  K 3.9 3.9  CL 105 102  CO2 25  --   GLUCOSE 108* 103*  BUN  19 21*  CREATININE 1.04* 1.00  CALCIUM 9.6  --     Recent Labs  06/08/15 2344  AST 19  ALT 25  ALKPHOS 39  BILITOT 1.0  PROT 6.6  ALBUMIN 3.9   No results for input(s): LIPASE, AMYLASE in the last 72 hours.  Recent Labs  06/08/15 2344 06/08/15 2352  WBC 10.3  --   NEUTROABS 4.8  --   HGB 14.0 14.6  HCT 40.6 43.0  MCV 87.5   --   PLT 239  --    No results for input(s): CKTOTAL, CKMB, CKMBINDEX, TROPONINI in the last 72 hours. Invalid input(s): POCBNP No results for input(s): DDIMER in the last 72 hours. No results for input(s): HGBA1C in the last 72 hours. No results for input(s): CHOL, HDL, LDLCALC, TRIG, CHOLHDL, LDLDIRECT in the last 72 hours. No results for input(s): TSH, T4TOTAL, T3FREE, THYROIDAB in the last 72 hours.  Invalid input(s): FREET3 No results for input(s): VITAMINB12, FOLATE, FERRITIN, TIBC, IRON, RETICCTPCT in the last 72 hours.  Micro Results: No results found for this or any previous visit (from the past 240 hour(s)).   Radiological Exams on Admission: Ct Head Wo Contrast  06/08/2015  CLINICAL DATA:  Code stroke.  Transient aphasia and confusion. EXAM: CT HEAD WITHOUT CONTRAST TECHNIQUE: Contiguous axial images were obtained from the base of the skull through the vertex without intravenous contrast. COMPARISON:  None available FINDINGS: Skull and Sinuses:Negative for fracture or destructive process. Mild thickening and punctate densities left supraorbital is likely scarring. Visualized orbits: Negative. Brain: Normal. No evidence of acute infarction, hemorrhage, hydrocephalus, or mass lesion/mass effect. Critical Value/emergent results were called by telephone at the time of interpretation on 06/08/2015 at 11:58 pm to Dr. Roseanne RenoStewart, who verbally acknowledged these results. IMPRESSION: Negative head CT. Electronically Signed   By: Marnee SpringJonathon  Watts M.D.   On: 06/08/2015 23:58    Assessment/Plan Present on Admission:  . Left-sided weakness -Bring in for 23 observation on telemetry -Differential diagnosis includes the TIA, seizures, complex migraine -Monitor patient in telemetry neuro -Patient seen by neurology -MRI brain in a.m., carotid ultrasound, 2-D echo, EEG -Lipid panel in a.m. -Baby aspirin -Neurochecks -Urine drug screen ordered -EKG ordered  Letisia Schwalb 06/09/2015, 1:04 AM

## 2015-06-09 NOTE — Consult Note (Signed)
Admission H&P    Chief Complaint: Altered mental status with transient speech difficulty and left-sided weakness.  HPI: Christine Hooper is an 25 y.o. female experienced sudden onset of left side weakness and difficulty with speech at 10:45 PM tonight. She has had a headache off and on for about 1 week. She also had an episode of incontinence urine. CT scan of her head was unremarkable with no acute intracranial abnormality. Patient's deficits subsequently resolved. He continued to have a headache with an intensity of 7/10. She has no previous history of migraine headaches and also has no history of seizure activity. Her mother noted that her left arm stiffened and left lower face drooped at the onset of her symptoms. Urine drug screen is pending.  LSN: 10:45 PM on 06/08/2015 tPA Given: No: Deficits resolved mRankin:  History reviewed. No pertinent past medical history.  Past Surgical History  Procedure Laterality Date  . Tonsillectomy      No family history on file. Social History:  reports that she has never smoked. She has never used smokeless tobacco. She reports that she drinks alcohol. She reports that she does not use illicit drugs.  Allergies: No Known Allergies  Medications: levonorgestrel (MIRENA) 20 MCG/24HR IUD  ROS: History obtained from mother and the patient  General ROS: negative for - chills, fatigue, fever, night sweats, weight gain or weight loss Psychological ROS: negative for - behavioral disorder, hallucinations, memory difficulties, mood swings or suicidal ideation Ophthalmic ROS: negative for - blurry vision, double vision, eye pain or loss of vision ENT ROS: negative for - epistaxis, nasal discharge, oral lesions, sore throat, tinnitus or vertigo Allergy and Immunology ROS: negative for - hives or itchy/watery eyes Hematological and Lymphatic ROS: negative for - bleeding problems, bruising or swollen lymph nodes Endocrine ROS: negative for - galactorrhea, hair  pattern changes, polydipsia/polyuria or temperature intolerance Respiratory ROS: negative for - cough, hemoptysis, shortness of breath or wheezing Cardiovascular ROS: negative for - chest pain, dyspnea on exertion, edema or irregular heartbeat Gastrointestinal ROS: negative for - abdominal pain, diarrhea, hematemesis, nausea/vomiting or stool incontinence Genito-Urinary ROS: negative for - dysuria, hematuria, incontinence or urinary frequency/urgency Musculoskeletal ROS: negative for - joint swelling or muscular weakness Neurological ROS: as noted in HPI Dermatological ROS: negative for rash and skin lesion changes  Physical Examination: Blood pressure 119/61, pulse 78, temperature 98.1 F (36.7 C), temperature source Oral, resp. rate 32, height _0  (1.651 m), weight 126.1 kg (278 lb), SpO2 100 %.  HEENT-  Normocephalic, no lesions, without obvious abnormality.  Normal external eye and conjunctiva.  Normal TM's bilaterally.  Normal auditory canals and external ears. Normal external nose, mucus membranes and septum.  Normal pharynx. Neck supple with no masses, nodes, nodules or enlargement. Cardiovascular - regular rate and rhythm, S1, S2 normal, no murmur, click, rub or gallop Lungs - chest clear, no wheezing, rales, normal symmetric air entry Abdomen - soft, non-tender; bowel sounds normal; no masses,  no organomegaly Extremities - no joint deformities, effusion, or inflammation and no edema  Neurologic Examination: Mental Status: Alert, oriented, thought content appropriate.  Speech fluent without evidence of aphasia. Able to follow commands without difficulty. Cranial Nerves: II-Visual fields were normal. III/IV/VI-Pupils were equal and reacted normally to light. Extraocular movements were full and conjugate.    V/VII-no facial numbness and no facial weakness. VIII-normal. X-normal speech and symmetrical palatal movement. XI: trapezius strength/neck flexion strength normal  bilaterally XII-midline tongue extension with normal strength. Motor: 5/5 bilaterally with  normal tone and bulk Sensory: Normal throughout. Deep Tendon Reflexes: Trace to 1+ and symmetric. Plantars: Flexor bilaterally Cerebellar: Normal finger-to-nose testing. Carotid auscultation: Normal  Results for orders placed or performed during the hospital encounter of 06/08/15 (from the past 48 hour(s))  Ethanol     Status: None   Collection Time: 06/08/15 11:44 PM  Result Value Ref Range   Alcohol, Ethyl (B) <5 <5 mg/dL    Comment:        LOWEST DETECTABLE LIMIT FOR SERUM ALCOHOL IS 5 mg/dL FOR MEDICAL PURPOSES ONLY   Protime-INR     Status: None   Collection Time: 06/08/15 11:44 PM  Result Value Ref Range   Prothrombin Time 14.7 11.6 - 15.2 seconds   INR 1.13 0.00 - 1.49  APTT     Status: None   Collection Time: 06/08/15 11:44 PM  Result Value Ref Range   aPTT 30 24 - 37 seconds  CBC     Status: None   Collection Time: 06/08/15 11:44 PM  Result Value Ref Range   WBC 10.3 4.0 - 10.5 K/uL   RBC 4.64 3.87 - 5.11 MIL/uL   Hemoglobin 14.0 12.0 - 15.0 g/dL   HCT 40.6 36.0 - 46.0 %   MCV 87.5 78.0 - 100.0 fL   MCH 30.2 26.0 - 34.0 pg   MCHC 34.5 30.0 - 36.0 g/dL   RDW 12.8 11.5 - 15.5 %   Platelets 239 150 - 400 K/uL  Differential     Status: Abnormal   Collection Time: 06/08/15 11:44 PM  Result Value Ref Range   Neutrophils Relative % 47 %   Neutro Abs 4.8 1.7 - 7.7 K/uL   Lymphocytes Relative 47 %   Lymphs Abs 4.8 (H) 0.7 - 4.0 K/uL   Monocytes Relative 6 %   Monocytes Absolute 0.6 0.1 - 1.0 K/uL   Eosinophils Relative 1 %   Eosinophils Absolute 0.1 0.0 - 0.7 K/uL   Basophils Relative 0 %   Basophils Absolute 0.0 0.0 - 0.1 K/uL  Comprehensive metabolic panel     Status: Abnormal   Collection Time: 06/08/15 11:44 PM  Result Value Ref Range   Sodium 140 135 - 145 mmol/L   Potassium 3.9 3.5 - 5.1 mmol/L   Chloride 105 101 - 111 mmol/L   CO2 25 22 - 32 mmol/L   Glucose,  Bld 108 (H) 65 - 99 mg/dL   BUN 19 6 - 20 mg/dL   Creatinine, Ser 1.04 (H) 0.44 - 1.00 mg/dL   Calcium 9.6 8.9 - 10.3 mg/dL   Total Protein 6.6 6.5 - 8.1 g/dL   Albumin 3.9 3.5 - 5.0 g/dL   AST 19 15 - 41 U/L   ALT 25 14 - 54 U/L   Alkaline Phosphatase 39 38 - 126 U/L   Total Bilirubin 1.0 0.3 - 1.2 mg/dL   GFR calc non Af Amer >60 >60 mL/min   GFR calc Af Amer >60 >60 mL/min    Comment: (NOTE) The eGFR has been calculated using the CKD EPI equation. This calculation has not been validated in all clinical situations. eGFR's persistently <60 mL/min signify possible Chronic Kidney Disease.    Anion gap 10 5 - 15  I-stat troponin, ED (not at [facility], [facility])     Status: None   Collection Time: 06/08/15 11:50 PM  Result Value Ref Range   Troponin i, poc 0.00 0.00 - 0.08 ng/mL   Comment 3  Comment: Due to the release kinetics of cTnI, a negative result within the first hours of the onset of symptoms does not rule out myocardial infarction with certainty. If myocardial infarction is still suspected, repeat the test at appropriate intervals.   I-Stat Chem 8, ED  (not at [facility], [facility])     Status: Abnormal   Collection Time: 06/08/15 11:52 PM  Result Value Ref Range   Sodium 141 135 - 145 mmol/L   Potassium 3.9 3.5 - 5.1 mmol/L   Chloride 102 101 - 111 mmol/L   BUN 21 (H) 6 - 20 mg/dL   Creatinine, Ser 1.00 0.44 - 1.00 mg/dL   Glucose, Bld 103 (H) 65 - 99 mg/dL   Calcium, Ion 1.14 1.12 - 1.23 mmol/L   TCO2 26 0 - 100 mmol/L   Hemoglobin 14.6 12.0 - 15.0 g/dL   HCT 43.0 36.0 - 46.0 %  CBG monitoring, ED     Status: None   Collection Time: 06/09/15 12:04 AM  Result Value Ref Range   Glucose-Capillary 97 65 - 99 mg/dL   Ct Head Wo Contrast  06/08/2015  CLINICAL DATA:  Code stroke.  Transient aphasia and confusion. EXAM: CT HEAD WITHOUT CONTRAST TECHNIQUE: Contiguous axial images were obtained from the base of the skull through the vertex without intravenous contrast.  COMPARISON:  None available FINDINGS: Skull and Sinuses:Negative for fracture or destructive process. Mild thickening and punctate densities left supraorbital is likely scarring. Visualized orbits: Negative. Brain: Normal. No evidence of acute infarction, hemorrhage, hydrocephalus, or mass lesion/mass effect. Critical Value/emergent results were called by telephone at the time of interpretation on 06/08/2015 at 11:58 pm to Dr. Nicole Kindred, who verbally acknowledged these results. IMPRESSION: Negative head CT. Electronically Signed   By: Monte Fantasia M.D.   On: 06/08/2015 23:58    Assessment: 25 y.o. female with no known risk factors for stroke presenting with transient left-sided weakness and speech difficulty, as well as confusion and urinary incontinence. Etiology is unclear. Diagnostic considerations include complicated migraine, TIA or possible seizure.  Stroke Risk Factors - none  Plan: 1. HgbA1c, fasting lipid panel 2. MRI, MRA  of the brain without contrast 3. PT consult, OT consult, Speech consult 4. Echocardiogram 5. Carotid dopplers 6. Prophylactic therapy-Antiplatelet med: Aspirin 7. Risk factor modification 8. Telemetry monitoring 9. Hypercoagulopathy panel 10. EEG, routine adult study  C.R. Nicole Kindred, MD [facility] [phone]  06/09/2015, 12:45 AM

## 2015-06-09 NOTE — Code Documentation (Signed)
[name] is a 25 yo wf [facility] for acute onset Lt side weakness and aphasia.  Per the pt she had returned home from the gym and her legs felt weak.  She stood up and became weaker on the left, incontinent, Lt facial droop, and was unable to speak.  This episode occurred at 2215 and was witnessed by her mother who noted [name]'s Lt hand to be "spasming closed".  By the arrival of EMS all of the symptoms except aphasia had resolved.  She was able to answer some questions for EMS but not all.  She states she was aware of what was happening during the event but was unable to move or speak.  By the [facility] she had returned to baseline with only complaints of a HA in the back of her head (7/10) and pressure behind her eyes.  She stated the HA had been waxing and waning since Sunday and had improved some with ibuprofen.  She denied blurred vision or N/V with the HA.  Currently NIH 0.  Will be placed on a TIA alert.

## 2015-06-09 NOTE — ED Notes (Signed)
Pt asking for pain meds for HA, EDP is aware. Pt family also asked registration, once again was informed that EDP is aware of pt wanting pain medicine.

## 2015-06-10 DIAGNOSIS — R6889 Other general symptoms and signs: Secondary | ICD-10-CM | POA: Diagnosis not present

## 2015-06-10 DIAGNOSIS — R4689 Other symptoms and signs involving appearance and behavior: Secondary | ICD-10-CM | POA: Insufficient documentation

## 2015-06-10 DIAGNOSIS — M6289 Other specified disorders of muscle: Secondary | ICD-10-CM | POA: Diagnosis not present

## 2015-06-10 LAB — HCG, QUANTITATIVE, PREGNANCY

## 2015-06-10 MED ORDER — ACETAMINOPHEN 325 MG PO TABS: 650.0000 mg | ORAL_TABLET | Freq: Four times a day (QID) | ORAL | Status: DC | PRN

## 2015-06-10 NOTE — Progress Notes (Signed)
Subjective: Mild HA 3/10 but much improved with Tylenol. No further tingling or speech issues.   Exam: Filed Vitals:   06/10/15 0627 06/10/15 0924  BP: 119/80 117/63  Pulse: 88 95  Temp: 98.5 F (36.9 C) 98 F (36.7 C)  Resp: 18 18        Gen: In bed, NAD MS: Alert andoriented CN: 2-12 intact Motor: MAEW Sensory:intact throughout   Pertinent Labs: MRI brain: normal CTA head and neck shows no large vessel occlusion.  EEG:  Clinical Interpretation: This normal EEG is recorded in the waking and sleep state. There was no seizure or seizure predisposition recorded on this study. Echo normal   Felicie MornDavid Smith PA-C [facility] [phone]  Impression: 25 yo F likely complicated migraine vs presyncope. She describes BILATERAL leg weakness and BILATERAL arm tingling with preserved conciousness. At this point, I think either seizure or TIA is very unlikley, but migraine is possible.    Recommendations: 1) No further recommendations at this time.  2) Please call with further questions or concerns.   Ritta SlotMcNeill Kaelah Hayashi, MD [facility] [phone]  If 7pm- 7am, please page neurology on call as listed in AMION.   06/10/2015, 9:45 AM

## 2015-06-10 NOTE — Progress Notes (Signed)
Pt ambulated in unit with nurse. No dizziness or balance problems. Telemetry discontinued and IV removed. Awaiting discharge orders from MD. Lawson RadarHeather M Kyla Duffy

## 2015-06-10 NOTE — Progress Notes (Signed)
Pt for discharge home today. Discharge orders received. IV and telemetry dcd with dressing clean dry and intact. Discharge instructions and 1 prescription given with verbalized understanding. Family at bedside to assist with discharge. Transported to home by family member at this time.

## 2015-06-10 NOTE — Discharge Summary (Signed)
Christine Hooper, is a 25 y.o. female  DOB April 01, 1990  MRN [id number].  Admission date:  06/08/2015  Admitting Physician  Gery Pray, MD  Discharge Date:  06/10/2015   Primary MD  DEAR, Earlyne Iba, MD  Recommendations for primary care physician for things to follow:   Monitor secondary to his factors for CVA.   Admission Diagnosis  Code stroke   Discharge Diagnosis  Code stroke    Principal Problem:   Left-sided weakness Active Problems:   Spell of abnormal behavior      History reviewed. No pertinent past medical history.  Past Surgical History  Procedure Laterality Date  . Tonsillectomy         HPI  from the history and physical done on the day of admission:    25 year old female who has been working out for weight loss the past 4 weeks. Family denies any use of weight loss drugs. Today after working out she went home and ate dinner. She attempted to sit up and immediately sat back down and stating ,"I do not feel right". Her mother noted her left hand was clenching and the patient developed slurred and garbled speech. She tried to stand and fell across the table. She became incontinent of urine. Her mom was present and checked her blood pressure was 182/100 with pulse of 94 and oxygen saturation of 98%. They attempted to get her to smile and her smile was asymmetrical. By time 911 arrived the patient's speech was completely incomprehensible.  This morning left side weakness has completely resolved. She has no slurred speech. Work up for seizure and stroke has been unremarkable, she is completely symptom free with minimal 2 out of 10 headache.     Hospital Course:   1. Headache with transient left-sided weakness, some urinary incontinence. Questionable seizure.  Underwent full stroke, seizure work up  which was entirely unremarkable including MRI brain, CT angiogram head and neck, carotid duplex and echogram, EEG unremarkable as well, was seen by neurology, her symptoms have completely resolved except for a dull generalized 2 out of 10 headache, per neurology suspicious for complex migraine vs Presyncope as he she now says that her symptoms most likely would've bilateral and in all 4 extremities.   Discussed the case with neurologist Dr. Burnadette Peter. Her symptoms could have been due to presyncope and patient should be discharged without any further workup. Outpatient urology workup recommended. Request PCP to monitor secondary to his factors for stroke.   2. Morbid Obesity. Follow with PCP.    Follow UP  Follow-up Information    Follow up with DEAR, Earlyne Iba, MD. Schedule an appointment as soon as possible for a visit in 1 week.   Specialty:  Family Medicine      Follow up with [facility]. Schedule an appointment as soon as possible for a visit in 1 week.   Why:  Complicated migraine   Contact information:   [address]     [address]  [address] [phone]       Consults obtained - Neuro  Discharge Condition: Stable  Diet and Activity recommendation: See Discharge Instructions below  Discharge Instructions           Discharge Instructions    Diet - low sodium heart healthy    Complete by:  As directed      Discharge instructions    Complete by:  As directed   Do not drive, operating heavy machinery, perform activities at heights, swimming or participation in water activities or provide baby sitting services until you have seen by Primary MD or a Neurologist and advised to do so again.  Follow with Primary MD DEAR, Earlyne Iba, MD in 7 days   Get CBC, CMP, 2 view Chest X ray checked  by Primary MD next visit.    Activity: As tolerated with Full fall precautions use walker/cane & assistance as  needed   Disposition Home    Diet:   Heart Healthy  .  For Heart failure patients - Check your Weight same time everyday, if you gain over 2 pounds, or you develop in leg swelling, experience more shortness of breath or chest pain, call your Primary MD immediately. Follow Cardiac Low Salt Diet and 1.5 lit/day fluid restriction.   On your next visit with your primary care physician please Get Medicines reviewed and adjusted.   Please request your Prim.MD to go over all Hospital Tests and Procedure/Radiological results at the follow up, please get all Hospital records sent to your Prim MD by signing hospital release before you go home.   If you experience worsening of your admission symptoms, develop shortness of breath, life threatening emergency, suicidal or homicidal thoughts you must seek medical attention immediately by calling 911 or calling your MD immediately  if symptoms less severe.  You Must read complete instructions/literature along with all the possible adverse reactions/side effects for all the Medicines you take and that have been prescribed to you. Take any new Medicines after you have completely understood and accpet all the possible adverse reactions/side effects.   Do not drive when taking Pain medications.    Do not take more than prescribed Pain, Sleep and Anxiety Medications  Special Instructions: If you have smoked or chewed Tobacco  in the last 2 yrs please stop smoking, stop any regular Alcohol  and or any Recreational drug use.  Wear Seat belts while driving.   Please note  You were cared for by a hospitalist during your hospital stay. If you have any questions about your discharge medications or the care you received while you were in the hospital after you are discharged, you can call the unit and asked to speak with the hospitalist on call if the hospitalist that took care of you is not available. Once you are discharged, your primary care physician will  handle any further medical issues. Please note that NO REFILLS for any discharge medications will be authorized once you are discharged, as it is imperative that you return to your primary care physician (or establish a relationship with a primary care physician if you do not have one) for your aftercare needs so that they can reassess your need for medications and monitor your lab values.     Increase activity slowly    Complete by:  As directed              Discharge Medications       Medication List    TAKE these medications  acetaminophen 325 MG tablet  Commonly known as:  TYLENOL  Take 2 tablets (650 mg total) by mouth every 6 (six) hours as needed for headache (or Fever >/= 101).     levonorgestrel 20 MCG/24HR IUD  Commonly known as:  MIRENA  1 each by Intrauterine route once.        Major procedures and Radiology Reports - PLEASE review detailed and final reports for all details, in brief -   TTE  Left ventricle: The cavity size was normal. Systolic function was  normal. The estimated ejection fraction was in the range of 55%  to 60%. Wall motion was normal; there were no regional wall  motion abnormalities. Left ventricular diastolic function  parameters were normal. - Aortic valve: Transvalvular velocity was within the normal range.  There was no stenosis. There was no regurgitation. - Mitral valve: Transvalvular velocity was within the normal range.  There was no evidence for stenosis. There was no regurgitation. - Right ventricle: The cavity size was normal. Wall thickness was  normal. Systolic function was normal. - Atrial septum: No defect or patent foramen ovale was identified  by color flow Doppler. - Tricuspid valve: There was no regurgitation. - Inferior vena cava: The vessel was normal in size. The  respirophasic diameter changes were in the normal range (>= 50%),  consistent with normal central venous pressure.    EEG  Clinical  Interpretation: This normal EEG is recorded in the waking and sleep state. There was no seizure or seizure predisposition recorded on this study. Please note that a normal EEG does not preclude the possibility of epilepsy.    Ct Angio Head W/cm &/or Wo Cm  06/09/2015  CLINICAL DATA:  25 year old female with acute onset slurred speech and left side weakness. Episode of abnormal behavior. Initial encounter. EXAM: CT ANGIOGRAPHY HEAD AND NECK TECHNIQUE: Multidetector CT imaging of the head and neck was performed using the standard protocol during bolus administration of intravenous contrast. Multiplanar CT image reconstructions and MIPs were obtained to evaluate the vascular anatomy. Carotid stenosis measurements (when applicable) are obtained utilizing NASCET criteria, using the distal internal carotid diameter as the denominator. CONTRAST:  80 mL Isovue 370 COMPARISON:  Brain MRI from 11/30 6 hours today, head CT 06/08/2015. FINDINGS: CTA NECK Skeleton: No osseous abnormality identified. Visualized paranasal sinuses and mastoids are clear. Other neck: Large body habitus. Negative lung apices. No superior mediastinal lymphadenopathy. Negative thyroid, larynx, pharynx, parapharyngeal spaces, retropharyngeal space, sublingual space, submandibular glands and parotid glands. No cervical lymphadenopathy. Aortic arch: 3 vessel arch configuration. No arch atherosclerosis or great vessel origin stenosis. Right carotid system: The right CCA origin is mildly obscured by dense right subclavian vein contrast. Otherwise negative. Normal right carotid bifurcation. Left carotid system: Negative.  Normal left carotid bifurcation. Vertebral arteries: No proximal right subclavian artery or right vertebral artery origin stenosis. Negative right vertebral artery to the skullbase. No proximal left subclavian artery or left vertebral artery origin stenosis. Codominant vertebral arteries, the left is normal to the skullbase. CTA HEAD  Posterior circulation: Normal codominant distal vertebral arteries. Normal PICA origins, the right origin is somewhat early. Normal vertebrobasilar junction. Normal basilar artery. Normal SCA and PCA origins. Diminutive posterior communicating arteries. Normal bilateral PCA branches. Anterior circulation: Both ICA siphons are patent and appear normal. Normal ophthalmic and posterior communicating artery origins. Normal carotid termini. Normal MCA and ACA origins. Normal anterior communicating artery. Bilateral ACA branches appear normal. Left MCA M1 segment, bifurcation, and left MCA branches  are within normal limits. Right MCA M1 segment, trifurcation, and right MCA branches are within normal limits. Venous sinuses: Patent. Anatomic variants: None. Delayed phase: No midline shift, ventriculomegaly, mass effect, evidence of mass lesion, intracranial hemorrhage or evidence of cortically based acute infarction. Gray-white matter differentiation is within normal limits throughout the brain. No abnormal enhancement identified. IMPRESSION: 1. Negative CTA head and neck. 2. Stable and normal CT appearance of the brain. Electronically Signed   By: Odessa Fleming M.D.   On: 06/09/2015 15:48   Dg Chest 2 View  06/09/2015  CLINICAL DATA:  Fever, left chest pain, slurred speech EXAM: CHEST  2 VIEW COMPARISON:  04/18/2007 FINDINGS: Lungs are clear.  No pleural effusion or pneumothorax. The heart is normal in size. Visualized osseous structures are within normal limits. IMPRESSION: Normal chest radiographs. Electronically Signed   By: Charline Bills M.D.   On: 06/09/2015 15:14   Ct Head Wo Contrast  06/08/2015  CLINICAL DATA:  Code stroke.  Transient aphasia and confusion. EXAM: CT HEAD WITHOUT CONTRAST TECHNIQUE: Contiguous axial images were obtained from the base of the skull through the vertex without intravenous contrast. COMPARISON:  None available FINDINGS: Skull and Sinuses:Negative for fracture or destructive process.  Mild thickening and punctate densities left supraorbital is likely scarring. Visualized orbits: Negative. Brain: Normal. No evidence of acute infarction, hemorrhage, hydrocephalus, or mass lesion/mass effect. Critical Value/emergent results were called by telephone at the time of interpretation on 06/08/2015 at 11:58 pm to Dr. Roseanne Reno, who verbally acknowledged these results. IMPRESSION: Negative head CT. Electronically Signed   By: Marnee Spring M.D.   On: 06/08/2015 23:58   Ct Angio Neck W/cm &/or Wo/cm  06/09/2015  CLINICAL DATA:  25 year old female with acute onset slurred speech and left side weakness. Episode of abnormal behavior. Initial encounter. EXAM: CT ANGIOGRAPHY HEAD AND NECK TECHNIQUE: Multidetector CT imaging of the head and neck was performed using the standard protocol during bolus administration of intravenous contrast. Multiplanar CT image reconstructions and MIPs were obtained to evaluate the vascular anatomy. Carotid stenosis measurements (when applicable) are obtained utilizing NASCET criteria, using the distal internal carotid diameter as the denominator. CONTRAST:  80 mL Isovue 370 COMPARISON:  Brain MRI from 11/30 6 hours today, head CT 06/08/2015. FINDINGS: CTA NECK Skeleton: No osseous abnormality identified. Visualized paranasal sinuses and mastoids are clear. Other neck: Large body habitus. Negative lung apices. No superior mediastinal lymphadenopathy. Negative thyroid, larynx, pharynx, parapharyngeal spaces, retropharyngeal space, sublingual space, submandibular glands and parotid glands. No cervical lymphadenopathy. Aortic arch: 3 vessel arch configuration. No arch atherosclerosis or great vessel origin stenosis. Right carotid system: The right CCA origin is mildly obscured by dense right subclavian vein contrast. Otherwise negative. Normal right carotid bifurcation. Left carotid system: Negative.  Normal left carotid bifurcation. Vertebral arteries: No proximal right subclavian  artery or right vertebral artery origin stenosis. Negative right vertebral artery to the skullbase. No proximal left subclavian artery or left vertebral artery origin stenosis. Codominant vertebral arteries, the left is normal to the skullbase. CTA HEAD Posterior circulation: Normal codominant distal vertebral arteries. Normal PICA origins, the right origin is somewhat early. Normal vertebrobasilar junction. Normal basilar artery. Normal SCA and PCA origins. Diminutive posterior communicating arteries. Normal bilateral PCA branches. Anterior circulation: Both ICA siphons are patent and appear normal. Normal ophthalmic and posterior communicating artery origins. Normal carotid termini. Normal MCA and ACA origins. Normal anterior communicating artery. Bilateral ACA branches appear normal. Left MCA M1 segment, bifurcation, and left MCA branches  are within normal limits. Right MCA M1 segment, trifurcation, and right MCA branches are within normal limits. Venous sinuses: Patent. Anatomic variants: None. Delayed phase: No midline shift, ventriculomegaly, mass effect, evidence of mass lesion, intracranial hemorrhage or evidence of cortically based acute infarction. Gray-white matter differentiation is within normal limits throughout the brain. No abnormal enhancement identified. IMPRESSION: 1. Negative CTA head and neck. 2. Stable and normal CT appearance of the brain. Electronically Signed   By: Odessa FlemingH  Hall M.D.   On: 06/09/2015 15:48   Mr Brain Wo Contrast  06/09/2015  CLINICAL DATA:  25 year old female with acute onset slurred speech, left side weakness. Headache. Initial encounter. EXAM: MRI HEAD WITHOUT CONTRAST TECHNIQUE: Multiplanar, multiecho pulse sequences of the brain and surrounding structures were obtained without intravenous contrast. COMPARISON:  Head CT 06/08/2015. FINDINGS: Cerebral volume is normal. No restricted diffusion to suggest acute infarction. No midline shift, mass effect, evidence of mass lesion,  ventriculomegaly, extra-axial collection or acute intracranial hemorrhage. Cervicomedullary junction and pituitary are within normal limits. Major intracranial vascular flow voids are preserved. Mild asymmetry of the temporal horns of the lateral ventricles appears to reflect normal anatomic variation. Wallace CullensGray and white matter signal is within normal limits throughout the brain. No encephalomalacia or chronic cerebral blood products identified. Visible internal auditory structures appear normal. Mastoids are clear. Trace paranasal sinus mucosal thickening. Negative orbit and scalp soft tissues. Negative visualized cervical spine aside from suggestion of os odontoideum (normal anatomic variation) with mild ligamentous hypertrophy. IMPRESSION: Negative noncontrast MRI appearance of the brain. Electronically Signed   By: Odessa FlemingH  Hall M.D.   On: 06/09/2015 11:49    Micro Results    No results found for this or any previous visit (from the past 240 hour(s)).   Today   Subjective    Christine BornKelly Hooper today has no headache,no chest abdominal pain,no new weakness tingling or numbness, feels much better wants to go home today.     Objective   Blood pressure 117/63, pulse 95, temperature 98 F (36.7 C), temperature source Oral, resp. rate 18, height 5\' 5"  (1.651 m), weight 126.1 kg (278 lb), SpO2 97 %.  No intake or output data in the 24 hours ending 06/10/15 1204  Exam Awake Alert, Oriented x 3, No new F.N deficits, Normal affect [place].AT,PERRAL Supple Neck,No JVD, No cervical lymphadenopathy appriciated.  Symmetrical Chest wall movement, Good air movement bilaterally, CTAB RRR,No Gallops,Rubs or new Murmurs, No Parasternal Heave +ve B.Sounds, Abd Soft, Non tender, No organomegaly appriciated, No rebound -guarding or rigidity. No Cyanosis, Clubbing or edema, No new Rash or bruise   Data Review   CBC w Diff:  Lab Results  Component Value Date   WBC 9.9 06/09/2015   WBC 16.4* 12/01/2011   HGB 13.8  06/09/2015   HGB 13.5 12/01/2011   HCT 40.2 06/09/2015   HCT 33.9* 12/03/2011   PLT 258 06/09/2015   PLT 293 12/01/2011   LYMPHOPCT 47 06/08/2015   LYMPHOPCT 14.5 12/01/2011   MONOPCT 6 06/08/2015   MONOPCT 4.0 12/01/2011   EOSPCT 1 06/08/2015   EOSPCT 0.3 12/01/2011   BASOPCT 0 06/08/2015   BASOPCT 0.4 12/01/2011    CMP:  Lab Results  Component Value Date   NA 139 06/09/2015   K 4.4 06/09/2015   CL 107 06/09/2015   CO2 22 06/09/2015   BUN 17 06/09/2015   CREATININE 0.85 06/09/2015   PROT 6.6 06/08/2015   ALBUMIN 3.9 06/08/2015   BILITOT 1.0 06/08/2015   ALKPHOS 39  06/08/2015   AST 19 06/08/2015   ALT 25 06/08/2015  .   Total Time in preparing paper work, data evaluation and todays exam - 35 minutes  Leroy Sea M.D on 06/10/2015 at 12:04 PM  [facility]   Office  [phone]

## 2015-06-10 NOTE — Progress Notes (Signed)
[facility]t change in assignment.

## 2015-06-10 NOTE — Discharge Instructions (Signed)
Do not drive, operating heavy machinery, perform activities at heights, swimming or participation in water activities or provide baby sitting services until you have seen by Primary MD or a Neurologist and advised to do so again.  Follow with Primary MD DEAR, Earlyne IbaJANET S, MD in 7 days   Get CBC, CMP, 2 view Chest X ray checked  by Primary MD next visit.    Activity: As tolerated with Full fall precautions use walker/cane & assistance as needed   Disposition Home    Diet:   Heart Healthy  .  For Heart failure patients - Check your Weight same time everyday, if you gain over 2 pounds, or you develop in leg swelling, experience more shortness of breath or chest pain, call your Primary MD immediately. Follow Cardiac Low Salt Diet and 1.5 lit/day fluid restriction.   On your next visit with your primary care physician please Get Medicines reviewed and adjusted.   Please request your Prim.MD to go over all Hospital Tests and Procedure/Radiological results at the follow up, please get all Hospital records sent to your Prim MD by signing hospital release before you go home.   If you experience worsening of your admission symptoms, develop shortness of breath, life threatening emergency, suicidal or homicidal thoughts you must seek medical attention immediately by calling 911 or calling your MD immediately  if symptoms less severe.  You Must read complete instructions/literature along with all the possible adverse reactions/side effects for all the Medicines you take and that have been prescribed to you. Take any new Medicines after you have completely understood and accpet all the possible adverse reactions/side effects.   Do not drive when taking Pain medications.    Do not take more than prescribed Pain, Sleep and Anxiety Medications  Special Instructions: If you have smoked or chewed Tobacco  in the last 2 yrs please stop smoking, stop any regular Alcohol  and or any Recreational drug  use.  Wear Seat belts while driving.   Please note  You were cared for by a hospitalist during your hospital stay. If you have any questions about your discharge medications or the care you received while you were in the hospital after you are discharged, you can call the unit and asked to speak with the hospitalist on call if the hospitalist that took care of you is not available. Once you are discharged, your primary care physician will handle any further medical issues. Please note that NO REFILLS for any discharge medications will be authorized once you are discharged, as it is imperative that you return to your primary care physician (or establish a relationship with a primary care physician if you do not have one) for your aftercare needs so that they can reassess your need for medications and monitor your lab values.

## 2015-06-14 ENCOUNTER — Encounter: Payer: Self-pay | Admitting: Neurology

## 2015-06-14 ENCOUNTER — Encounter: Payer: Self-pay | Admitting: *Deleted

## 2015-06-14 ENCOUNTER — Ambulatory Visit (INDEPENDENT_AMBULATORY_CARE_PROVIDER_SITE_OTHER): Payer: Medicaid Other | Admitting: Neurology

## 2015-06-14 ENCOUNTER — Telehealth: Payer: Self-pay | Admitting: *Deleted

## 2015-06-14 VITALS — BP 103/66 | HR 86 | Ht 65.0 in | Wt 282.0 lb

## 2015-06-14 DIAGNOSIS — R531 Weakness: Secondary | ICD-10-CM

## 2015-06-14 DIAGNOSIS — R6889 Other general symptoms and signs: Secondary | ICD-10-CM

## 2015-06-14 DIAGNOSIS — G43709 Chronic migraine without aura, not intractable, without status migrainosus: Secondary | ICD-10-CM | POA: Insufficient documentation

## 2015-06-14 DIAGNOSIS — M6289 Other specified disorders of muscle: Secondary | ICD-10-CM | POA: Diagnosis not present

## 2015-06-14 DIAGNOSIS — R4689 Other symptoms and signs involving appearance and behavior: Secondary | ICD-10-CM

## 2015-06-14 DIAGNOSIS — IMO0002 Reserved for concepts with insufficient information to code with codable children: Secondary | ICD-10-CM | POA: Insufficient documentation

## 2015-06-14 LAB — CULTURE, BLOOD (ROUTINE X 2)
CULTURE: NO GROWTH
Culture: NO GROWTH

## 2015-06-14 MED ORDER — DICLOFENAC POTASSIUM(MIGRAINE) 50 MG PO PACK: 50.0000 mg | PACK | ORAL | Status: DC | PRN

## 2015-06-14 MED ORDER — RIZATRIPTAN BENZOATE 10 MG PO TBDP: ORAL_TABLET | ORAL | Status: DC

## 2015-06-14 MED ORDER — TOPIRAMATE 100 MG PO TABS: 100.0000 mg | ORAL_TABLET | Freq: Two times a day (BID) | ORAL | Status: DC

## 2015-06-14 MED ORDER — ONDANSETRON 4 MG PO TBDP
4.0000 mg | ORAL_TABLET | Freq: Three times a day (TID) | ORAL | Status: DC | PRN
Start: 2015-06-14 — End: 2015-06-14

## 2015-06-14 MED ORDER — ONDANSETRON 4 MG PO TBDP: 4.0000 mg | ORAL_TABLET | Freq: Three times a day (TID) | ORAL | Status: DC | PRN
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
## 2015-06-14 NOTE — Addendum Note (Signed)
Addended by: Levert FeinsteinYAN, Keren Alverio on: 06/14/2015 10:50 AM   Modules accepted: Level of Service

## 2015-06-14 NOTE — Patient Instructions (Signed)
Magnesium oxide 400 mg twice a day Riboflavin  100 mg twice a day 

## 2015-06-14 NOTE — Telephone Encounter (Addendum)
Dr. Terrace ArabiaYan has provided prescriptions for topiramate, ondansetron and rizatriptan.  Sent to [facility] [place] w/ [facility].  Patient's mother, Velna HatchetSheila (on [place]IP[place]) aware.

## 2015-06-14 NOTE — Telephone Encounter (Addendum)
Pt's mother called stating the medication, topiramate (TOPAMAX) 100 MG tablet , ondansetron (ZOFRAN ODT) 4 MG disintegrating tablet , Diclofenac Potassium 50 MG PACK is not ready [facility] pick [facility] at [facility] [address]. Please call [phone]

## 2015-06-14 NOTE — Telephone Encounter (Signed)
Left message for a return call

## 2015-06-14 NOTE — Addendum Note (Signed)
Addended by: Lindell SparKIRKMAN, MICHELLE C on: 06/14/2015 02:01 PM   Modules accepted: Orders

## 2015-06-15 ENCOUNTER — Ambulatory Visit: Payer: MEDICAID | Admitting: Diagnostic Neuroimaging

## 2015-06-16 ENCOUNTER — Telehealth: Payer: Self-pay | Admitting: Neurology

## 2015-06-16 ENCOUNTER — Encounter: Payer: Self-pay | Admitting: *Deleted

## 2015-06-16 MED ORDER — SUMATRIPTAN SUCCINATE 100 MG PO TABS: 100.0000 mg | ORAL_TABLET | Freq: Once | ORAL | Status: DC | PRN

## 2015-06-16 NOTE — Telephone Encounter (Signed)
Patient's mother is calling stating the patient's headache is as bad than ever and wonders if she should go to the ER or what should she do. Please call and advise.

## 2015-06-16 NOTE — Telephone Encounter (Signed)
She had an infusion in our office on 06/14/15 of Depakote 1gram, Toradol 30mg , Compazine 10mg  with some improvement in pain.  Says she went home to rest and her headache continued to mildly improve.  Spoke to her mother today - states patient's headache is back to being severe - nausea, vomiting, dizzy.  She has tried Maxalt without relief.

## 2015-06-16 NOTE — Telephone Encounter (Signed)
Per Dr. Terrace ArabiaYan, can come in for repeat IV and try sumatriptan.  Patient agreeable to come in today for a repeat infusion (meds listed below) - her mother can drive her to the appointment.  She would also like to try Imitrex instead of the Maxalt and Dr. Terrace ArabiaYan has provided a new rx (escribed to the pharmacy with GoodRx coupon - cash copay should be $16.71).

## 2015-06-23 ENCOUNTER — Telehealth: Payer: Self-pay | Admitting: Neurology

## 2015-06-23 NOTE — Telephone Encounter (Signed)
Symptom onset of intermittent dizziness and right arm numbness which started today. She states she still has a headache. Please call and advise.

## 2015-06-23 NOTE — Telephone Encounter (Signed)
Spoke to patient who reports still having a headache, along with right arm numbness and dizziness.  She has not tried any home meds yet.  Dr. Terrace ArabiaYan reviewed her chart and recent normal imaging studies.  Instructed her to continue medications, as prescribed.  Per Dr. Terrace ArabiaYan, ok to use Benadryl and Tylenol or Advil along with her sumatriptan.  Patient agreeable to plan and will call back with any further concerns.

## 2015-07-26 ENCOUNTER — Ambulatory Visit (INDEPENDENT_AMBULATORY_CARE_PROVIDER_SITE_OTHER): Payer: Medicaid Other | Admitting: Neurology

## 2015-07-26 ENCOUNTER — Encounter: Payer: Self-pay | Admitting: Neurology

## 2015-07-26 VITALS — BP 119/72 | HR 78 | Ht 69.0 in | Wt 283.2 lb

## 2015-07-26 DIAGNOSIS — IMO0002 Reserved for concepts with insufficient information to code with codable children: Secondary | ICD-10-CM

## 2015-07-26 DIAGNOSIS — G43709 Chronic migraine without aura, not intractable, without status migrainosus: Secondary | ICD-10-CM | POA: Diagnosis not present

## 2015-07-26 MED ORDER — RIZATRIPTAN BENZOATE 10 MG PO TBDP: 10.0000 mg | ORAL_TABLET | ORAL | Status: DC | PRN

## 2015-07-26 NOTE — Patient Instructions (Signed)
For the severe migraines  You may take Maxalt 10 mg  Zofran 4 mg  Ibuprofen 200-600 mg  Benadryl 25-50 mg  May even consider Tylenol 500 mg as needed
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
## 2016-11-22 ENCOUNTER — Ambulatory Visit (INDEPENDENT_AMBULATORY_CARE_PROVIDER_SITE_OTHER): Payer: BLUE CROSS/BLUE SHIELD | Admitting: Obstetrics and Gynecology

## 2016-11-22 ENCOUNTER — Encounter: Payer: Self-pay | Admitting: Obstetrics and Gynecology

## 2016-11-22 VITALS — BP 114/82 | HR 98 | Ht 63.0 in | Wt 307.0 lb

## 2016-11-22 DIAGNOSIS — Z6841 Body Mass Index (BMI) 40.0 and over, adult: Secondary | ICD-10-CM

## 2016-11-22 DIAGNOSIS — Z01419 Encounter for gynecological examination (general) (routine) without abnormal findings: Secondary | ICD-10-CM

## 2016-11-22 DIAGNOSIS — R635 Abnormal weight gain: Secondary | ICD-10-CM | POA: Diagnosis not present

## 2016-11-22 DIAGNOSIS — Z124 Encounter for screening for malignant neoplasm of cervix: Secondary | ICD-10-CM

## 2016-11-22 MED ORDER — LIRAGLUTIDE -WEIGHT MANAGEMENT 18 MG/3ML ~~LOC~~ SOPN: 3.0000 mg | PEN_INJECTOR | Freq: Every day | SUBCUTANEOUS | 2 refills | Status: DC

## 2016-11-22 MED ORDER — INSULIN PEN NEEDLE 32G X 6 MM MISC: 3 refills | Status: DC

## 2016-11-22 NOTE — Patient Instructions (Signed)

## 2016-11-22 NOTE — Progress Notes (Signed)
Patient ID: Christine Hooper, female   DOB: 12-19-1990, 26 y.o.   MRN: [id number]     Gynecology Annual Exam  PCP: Vena Austria, MD  Chief Complaint:  Chief Complaint  Patient presents with  . Gynecologic Exam    dicuss weight gain and thyroid issues    History of Present Illness: Patient is a 26 y.o. G2P1011 presents for annual exam. The patient has no complaints today.   LMP: No LMP recorded. Patient is not currently having periods (Reason: IUD).  The patient is not currently sexually active. She currently uses IUD for contraception. She denies dyspareunia.  The patient does perform self breast exams.  There is no notable family history of breast or ovarian cancer in her family (Maternal grandmother only and late 28's).  The patient wears seatbelts: yes.   The patient has regular exercise: not asked.    The patient denies current symptoms of depression.     Patientis a 26 y.o. G60P1011 female, who presents for the evaluation of weight gain. She has gained 40lbs pounds primarily over past 3 years. The patient states the following issues have contributed to her weight problem: none identified.  The patient has no additional symptoms. The patient specifically denies memory loss, muscle weakness, excessive thirst, and polyuria. Weight related co-morbidities include none. The patient's past medical history is notable for non-contributory. She has tried phentermine in the past with side-effects noted requiring discontinuation.   Review of Systems: Review of Systems  Constitutional: Negative for chills and fever.  HENT: Negative for congestion.   Respiratory: Negative for cough and shortness of breath.   Cardiovascular: Negative for chest pain and palpitations.  Gastrointestinal: Negative for abdominal pain, constipation, diarrhea, heartburn, nausea and vomiting.  Genitourinary: Negative for dysuria, frequency and urgency.  Skin: Negative for itching and rash.  Neurological: Negative  for dizziness and headaches.  Endo/Heme/Allergies: Negative for polydipsia.  Psychiatric/Behavioral: Negative for depression.    Past Medical History:  Past Medical History:  Diagnosis Date  . Migraine     Past Surgical History:  Past Surgical History:  Procedure Laterality Date  . TONSILLECTOMY AND ADENOIDECTOMY    . Tubes in ears    . WRIST SURGERY Bilateral     Gynecologic History:  No LMP recorded. Patient is not currently having periods (Reason: IUD). Contraception: IUD Mirena 07/17/2012 Last Pap: Results were: no abnormalities 09/10/12  Obstetric History: G2P1011  Family History:  Family History  Problem Relation Age of Onset  . Healthy Father   . Diabetes Mother     Social History:  Social History   Social History  . Marital status: Single    Spouse name: N/A  . Number of children: 1  . Years of education: Trade Sch   Occupational History  . Service Writer    Social History Main Topics  . Smoking status: Never Smoker  . Smokeless tobacco: Never Used  . Alcohol use Yes     Comment: occasional   . Drug use: No  . Sexual activity: Not Currently    Birth control/ protection: IUD   Other Topics Concern  . Not on file   Social History Narrative   Lives at home with son.   Right-handed.   1 cup caffeine per day.    Allergies:  No Known Allergies  Medications: Prior to Admission medications   Medication Sig Start Date End Date Taking? Authorizing Provider  acetaminophen (TYLENOL) 325 MG tablet Take 2 tablets (650 mg total) by mouth every  6 (six) hours as needed for headache (or Fever >/= 101). 06/10/15   Leroy Sea, MD  ibuprofen (ADVIL,MOTRIN) 800 MG tablet Take 800 mg by mouth every 8 (eight) hours as needed.    [provider]  levonorgestrel (MIRENA) 20 MCG/24HR IUD 1 each by Intrauterine route once.    [provider]  rizatriptan (MAXALT-MLT) 10 MG disintegrating tablet Take 1 tablet (10 mg total) by mouth as needed.  May repeat in 2 hours if needed 07/26/15   Levert Feinstein, MD    Physical Exam Vitals: Blood pressure 114/82, pulse 98, height  (1.6 m), weight (!) 307 lb (139.3 kg). Body mass index is 54.38 kg/m.  General: NAD HEENT: normocephalic, anicteric Thyroid: no enlargement, no palpable nodules Pulmonary: No increased work of breathing, CTAB Cardiovascular: RRR, distal pulses 2+ Breast: Breast symmetrical, no tenderness, no palpable nodules or masses, no skin or nipple retraction present, no nipple discharge.  No axillary or supraclavicular lymphadenopathy. Abdomen: NABS, soft, non-tender, non-distended.  Umbilicus without lesions.  No hepatomegaly, splenomegaly or masses palpable. No evidence of hernia  Genitourinary:  External: Normal external female genitalia.  Normal urethral meatus, normal  Bartholin's and Skene's glands.    Vagina: Normal vaginal mucosa, no evidence of prolapse.    Cervix: Grossly normal in appearance, no bleeding, IUD strings visualized 2cm  Uterus: Non-enlarged, mobile, normal contour.  No CMT  Adnexa: ovaries non-enlarged, no adnexal masses  Rectal: deferred  Lymphatic: no evidence of inguinal lymphadenopathy Extremities: no edema, erythema, or tenderness Neurologic: Grossly intact Psychiatric: mood appropriate, affect full  Female chaperone present for pelvic and breast  portions of the physical exam    Assessment: 26 y.o. G2P1011 routine annual exam  Plan: Problem List Items Addressed This Visit    None    Visit Diagnoses    Class 3 severe obesity without serious comorbidity with body mass index (BMI) of 50.0 to 59.9 in adult, unspecified obesity type (HCC)    -  Primary   Relevant Medications   Liraglutide -Weight Management (SAXENDA) 18 MG/3ML SOPN   Other Relevant Orders   TSH   Hemoglobin A1c   Screening for malignant neoplasm of cervix       Relevant Orders   PapIG, HPV, rfx 16/18   Encounter for gynecological examination without abnormal  finding       Weight gain       Relevant Orders   TSH   Hemoglobin A1c     Annual 1) STI screening was offered and declined  2) ASCCP guidelines and rational discussed.  Patient opts for every 3 years screening interval  3) Contraception - continue Mirena  4) Routine healthcare maintenance including cholesterol, diabetes screening discussed managed by PCP  5) Follow up 1 year for routine annual exam  Obesity 1) 1500 Calorie ADA Diet  2) Patient education given regarding appropriate lifestyle changes for weight loss including: regular physical activity, healthy coping strategies, caloric restriction and healthy eating patterns.  3) Patient will be started on weight loss medication. The risks and benefits and side effects of medication, such as Adipex (Phenteramine) ,  Tenuate (Diethylproprion), Belviq (lorcarsin), Contrave (buproprion/naltrexone), Qsymia (phentermine/topiramate), and Saxenda (liraglutide) is discussed. The pros and cons of suppressing appetite and boosting metabolism is discussed. Risks of tolerence and addiction is discussed for selected agents discussed. Use of medicine will ne short term, such as 3-4 months at a time followed by a period of time off of the medicine to avoid these risks  and side effects for Adipex, Qsymia, and Tenuate discussed. Pt to call with any negative side effects and agrees to keep follow up appts. - not candidate for Qsymia or Adipex given prior intolerance of phentermine, current use of Topamax  - start saxenda, injection teaching done  4) Comorbidity Screening - hypothyroidism screening, diabetes, and hyperlipidemia screening offered - TSH and HgbA1C obtaind - The patient brother was diagnosed with Cushings disease in the past year.  If labs are normal we discussed that I do not do cortisol suppression test but I do 24-hr urine cortisol measurement  5) Encouraged weekly weight monitorig to track progress and sample 1 week food diary  6)  Contraception - discussed that all weight loss drugs fall in to pregnancy category X, patient currently has reliable contraception in the form of Mirena IUD  7) 15 minutes face-to-face; counseling/coordination of care > 50 percent of visit  8) Follow up in 12 weeks to assess response

## 2016-11-23 ENCOUNTER — Encounter: Payer: Self-pay | Admitting: Obstetrics and Gynecology

## 2016-11-23 LAB — HEMOGLOBIN A1C
Est. average glucose Bld gHb Est-mCnc: 108 mg/dL
Hgb A1c MFr Bld: 5.4 % (ref 4.8–5.6)

## 2016-11-23 LAB — TSH: TSH: 1.17 u[IU]/mL (ref 0.450–4.500)

## 2016-11-24 LAB — PAPIG, HPV, RFX 16/18
HPV, HIGH-RISK: NEGATIVE
PAP Smear Comment: 0

## 2016-11-26 ENCOUNTER — Encounter: Payer: Self-pay | Admitting: Obstetrics and Gynecology

## 2017-02-08 ENCOUNTER — Encounter: Payer: Self-pay | Admitting: Obstetrics and Gynecology

## 2017-02-21 ENCOUNTER — Encounter: Payer: Self-pay | Admitting: Obstetrics and Gynecology

## 2017-02-21 ENCOUNTER — Ambulatory Visit (INDEPENDENT_AMBULATORY_CARE_PROVIDER_SITE_OTHER): Payer: BLUE CROSS/BLUE SHIELD | Admitting: Obstetrics and Gynecology

## 2017-02-21 VITALS — BP 122/82 | HR 93 | Ht 63.5 in | Wt 300.0 lb

## 2017-02-21 DIAGNOSIS — Z6841 Body Mass Index (BMI) 40.0 and over, adult: Secondary | ICD-10-CM | POA: Diagnosis not present

## 2017-02-21 MED ORDER — PHENTERMINE HCL 37.5 MG PO TABS: 18.7500 mg | ORAL_TABLET | Freq: Every day | ORAL | 0 refills | Status: DC

## 2017-02-21 NOTE — Progress Notes (Signed)
Gynecology Office Visit  Chief Complaint:  Chief Complaint  Patient presents with  . Weight Check    History of Present Illness: Patientis a 27 y.o. G37P1011 female, who presents for the evaluation of the desire to lose weight. She has lost 7 pounds. The patient states the following symptoms since starting her weight loss therapy: appetite suppression, energy, and weight loss.  The patient also reports no other ill effects. The patient specifically denies heart palpitations, anxiety, and insomnia. She did not obtain prior authorization for her Bernie Covey (denied).   Review of Systems: 10 point review of systems negative unless otherwise noted in HPI  Past Medical History:  Past Medical History:  Diagnosis Date  . Migraine     Past Surgical History:  Past Surgical History:  Procedure Laterality Date  . TONSILLECTOMY AND ADENOIDECTOMY    . Tubes in ears    . WRIST SURGERY Bilateral     Gynecologic History: No LMP recorded. Patient is not currently having periods (Reason: IUD).  Obstetric History: G2P1011  Family History:  Family History  Problem Relation Age of Onset  . Healthy Father   . Diabetes Mother     Social History:  Social History   Socioeconomic History  . Marital status: Single    Spouse name: Not on file  . Number of children: 1  . Years of education: Trade Sch  . Highest education level: Not on file  Social Needs  . Financial resource strain: Not on file  . Food insecurity - worry: Not on file  . Food insecurity - inability: Not on file  . Transportation needs - medical: Not on file  . Transportation needs - non-medical: Not on file  Occupational History  . Occupation: Chartered certified accountant  Tobacco Use  . Smoking status: Never Smoker  . Smokeless tobacco: Never Used  Substance and Sexual Activity  . Alcohol use: Yes    Comment: occasional   . Drug use: No  . Sexual activity: Not Currently    Birth control/protection: IUD  Other Topics Concern    . Not on file  Social History Narrative   Lives at home with son.   Right-handed.   1 cup caffeine per day.    Allergies:  No Known Allergies  Medications: Prior to Admission medications   Medication Sig Start Date End Date Taking? Authorizing Provider  acetaminophen (TYLENOL) 325 MG tablet Take 2 tablets (650 mg total) by mouth every 6 (six) hours as needed for headache (or Fever >/= 101). 06/10/15  Yes Leroy Sea, MD  ibuprofen (ADVIL,MOTRIN) 800 MG tablet Take 800 mg by mouth every 8 (eight) hours as needed.   Yes [provider]  levonorgestrel (MIRENA) 20 MCG/24HR IUD 1 each by Intrauterine route once.   Yes [provider]  rizatriptan (MAXALT-MLT) 10 MG disintegrating tablet Take 1 tablet (10 mg total) by mouth as needed. May repeat in 2 hours if needed 07/26/15  Yes Levert Feinstein, MD    Physical Exam Blood pressure 122/82, pulse 93, height 5' 3.5" (1.613 m), weight 300 lb (136.1 kg). Body mass index is 52.31 kg/m.  General: NAD HEENT: normocephalic, anicteric Thyroid: no enlargement Pulmonary: no increased work of breathing Neurologic: Grossly intact Psychiatric: mood appropriate, affect full  Assessment: 27 y.o. G2P1011 No problem-specific Assessment & Plan notes found for this encounter.   Plan: Problem List Items Addressed This Visit    None    Visit Diagnoses    Class 3 severe  obesity without serious comorbidity with body mass index (BMI) of 50.0 to 59.9 in adult, unspecified obesity type (HCC)    -  Primary   Relevant Medications   phentermine (ADIPEX-P) 37.5 MG tablet      1) 1500 Calorie ADA Diet  2) Patient education given regarding appropriate lifestyle changes for weight loss including: regular physical activity, healthy coping strategies, caloric restriction and healthy eating patterns.  3) Patient will be started on weight loss medication. The risks and benefits and side effects of medication, such as Adipex (Phenteramine) ,   Tenuate (Diethylproprion), Belviq (lorcarsin), Contrave (buproprion/naltrexone), Qsymia (phentermine/topiramate), and Saxenda (liraglutide) is discussed. The pros and cons of suppressing appetite and boosting metabolism is discussed. Risks of tolerence and addiction is discussed for selected agents discussed. Use of medicine will ne short term, such as 3-4 months at a time followed by a period of time off of the medicine to avoid these risks and side effects for Adipex, Qsymia, and Tenuate discussed. Pt to call with any negative side effects and agrees to keep follow up appts.  4) Patient to take medication, with the benefits of appetite suppression and metabolism boost d/w pt, along with the side effects and risk factors of long term use that will be avoided with our use of short bursts of therapy. Rx provided for adipex   5) 15 minutes face-to-face; with counseling/coordination of care > 50 percent of visit related to obesity and ongoing management/treatment   6) Follow up in 4 weeks to assess response

## 2017-03-20 ENCOUNTER — Ambulatory Visit: Payer: BLUE CROSS/BLUE SHIELD | Admitting: Obstetrics and Gynecology

## 2017-03-27 ENCOUNTER — Ambulatory Visit: Payer: BLUE CROSS/BLUE SHIELD | Admitting: Obstetrics and Gynecology

## 2017-04-11 ENCOUNTER — Encounter: Payer: Self-pay | Admitting: Obstetrics and Gynecology

## 2017-04-11 ENCOUNTER — Ambulatory Visit: Payer: BLUE CROSS/BLUE SHIELD | Admitting: Obstetrics and Gynecology

## 2017-04-11 VITALS — BP 116/76 | HR 85 | Ht 63.5 in | Wt 305.0 lb

## 2017-04-11 DIAGNOSIS — Z6841 Body Mass Index (BMI) 40.0 and over, adult: Secondary | ICD-10-CM | POA: Diagnosis not present

## 2017-04-11 DIAGNOSIS — Z8349 Family history of other endocrine, nutritional and metabolic diseases: Secondary | ICD-10-CM | POA: Diagnosis not present

## 2017-04-11 MED ORDER — NALTREXONE-BUPROPION HCL ER 8-90 MG PO TB12: 2.0000 | ORAL_TABLET | Freq: Two times a day (BID) | ORAL | 1 refills | Status: DC

## 2017-04-11 MED ORDER — NALTREXONE-BUPROPION HCL ER 8-90 MG PO TB12: ORAL_TABLET | ORAL | 0 refills | Status: DC

## 2017-04-11 NOTE — Progress Notes (Signed)
Gynecology Office Visit  Chief Complaint:  Chief Complaint  Patient presents with  . Weight Check    History of Present Illness: Patientis a 27 y.o. G91P1011 female, who presents for the evaluation of the desire to lose weight. She has gained 5 pounds. The patient states the following symptoms since starting her weight loss therapy: appetite suppression, energy, and weight loss.  The patient also reports no other ill effects. The patient specifically denies heart palpitations, anxiety, and insomnia.    Review of Systems: 10 point review of systems negative unless otherwise noted in HPI  Past Medical History:  Past Medical History:  Diagnosis Date  . Migraine     Past Surgical History:  Past Surgical History:  Procedure Laterality Date  . TONSILLECTOMY AND ADENOIDECTOMY    . Tubes in ears    . WRIST SURGERY Bilateral     Gynecologic History: No LMP recorded. Patient is not currently having periods (Reason: IUD).  Obstetric History: G2P1011  Family History:  Family History  Problem Relation Age of Onset  . Healthy Father   . Diabetes Mother     Social History:  Social History   Socioeconomic History  . Marital status: Single    Spouse name: Not on file  . Number of children: 1  . Years of education: Trade Sch  . Highest education level: Not on file  Social Needs  . Financial resource strain: Not on file  . Food insecurity - worry: Not on file  . Food insecurity - inability: Not on file  . Transportation needs - medical: Not on file  . Transportation needs - non-medical: Not on file  Occupational History  . Occupation: Chartered certified accountant  Tobacco Use  . Smoking status: Never Smoker  . Smokeless tobacco: Never Used  Substance and Sexual Activity  . Alcohol use: Yes    Comment: occasional   . Drug use: No  . Sexual activity: Not Currently    Birth control/protection: IUD  Other Topics Concern  . Not on file  Social History Narrative   Lives at home  with son.   Right-handed.   1 cup caffeine per day.    Allergies:  No Known Allergies  Medications: Prior to Admission medications   Medication Sig Start Date End Date Taking? Authorizing Provider  levonorgestrel (MIRENA) 20 MCG/24HR IUD 1 each by Intrauterine route once.   Yes [provider]  phentermine (ADIPEX-P) 37.5 MG tablet Take 0.5 tablets (18.75 mg total) by mouth daily before breakfast. 02/21/17  Yes Vena Austria, MD  rizatriptan (MAXALT-MLT) 10 MG disintegrating tablet Take 1 tablet (10 mg total) by mouth as needed. May repeat in 2 hours if needed 07/26/15  Yes Levert Feinstein, MD  acetaminophen (TYLENOL) 325 MG tablet Take 2 tablets (650 mg total) by mouth every 6 (six) hours as needed for headache (or Fever >/= 101). Patient not taking: Reported on 04/11/2017 06/10/15   Leroy Sea, MD  ibuprofen (ADVIL,MOTRIN) 800 MG tablet Take 800 mg by mouth every 8 (eight) hours as needed.    [provider]    Physical Exam Blood pressure 116/76, pulse 85, height 5' 3.5" (1.613 m), weight (!) 305 lb (138.3 kg). Wt Readings from Last 3 Encounters:  04/11/17 (!) 305 lb (138.3 kg)  02/21/17 300 lb (136.1 kg)  11/22/16 (!) 307 lb (139.3 kg)    General: NAD HEENT: normocephalic, anicteric Thyroid: no enlargement Pulmonary: no increased work of breathing Neurologic: Grossly intact Psychiatric: mood  appropriate, affect full  Assessment: 27 y.o. G2P1011 No problem-specific Assessment & Plan notes found for this encounter.   Plan: Problem List Items Addressed This Visit    None    Visit Diagnoses    Class 3 severe obesity without serious comorbidity with body mass index (BMI) of 40.0 to 44.9 in adult, unspecified obesity type (HCC)    -  Primary   Relevant Medications   Naltrexone-buPROPion HCl ER (CONTRAVE) 8-90 MG TB12   Naltrexone-buPROPion HCl ER (CONTRAVE) 8-90 MG TB12   Other Relevant Orders   Cortisol, urine, free   Family history of Cushing  disease       Relevant Orders   Cortisol, urine, free      1) 1500 Calorie ADA Diet  2) Patient education given regarding appropriate lifestyle changes for weight loss including: regular physical activity, healthy coping strategies, caloric restriction and healthy eating patterns.  3) Patient will be started on weight loss medication. The risks and benefits and side effects of medication, such as Adipex (Phenteramine) ,  Tenuate (Diethylproprion), Belviq (lorcarsin), Contrave (buproprion/naltrexone), Qsymia (phentermine/topiramate), and Saxenda (liraglutide) is discussed. The pros and cons of suppressing appetite and boosting metabolism is discussed. Risks of tolerence and addiction is discussed for selected agents discussed. Use of medicine will ne short term, such as 3-4 months at a time followed by a period of time off of the medicine to avoid these risks and side effects for Adipex, Qsymia, and Tenuate discussed. Pt to call with any negative side effects and agrees to keep follow up appts.  4) Patient to take medication, with the benefits of appetite suppression and metabolism boost d/w pt, along with the side effects and risk factors of long term use that will be avoided with our use of short bursts of therapy. Rx provided.  - switch to contrave - despite noting increased energy no weight loss - given brother recent diagnosis of Cushings disease will set up for 24-hr urine cortisol   5) 15 minutes face-to-face; with counseling/coordination of care > 50 percent of visit related to obesity and ongoing management/treatment   6)  Return in about 3 months (around 07/09/2017).    Vena AustriaAndreas Aamira Bischoff, MD, Merlinda FrederickFACOG [facility], [facility] 04/11/2017, 9:46 PM

## 2017-05-15 ENCOUNTER — Encounter (INDEPENDENT_AMBULATORY_CARE_PROVIDER_SITE_OTHER): Payer: Self-pay

## 2017-05-15 LAB — CORTISOL, URINE, FREE
CORTISOL (UR), FREE: 32 ug/(24.h) (ref 6–42)
CORTISOL,F,UG/L,U: 40 ug/L

## 2017-07-04 ENCOUNTER — Ambulatory Visit: Payer: BLUE CROSS/BLUE SHIELD | Admitting: Obstetrics and Gynecology

## 2017-08-09 ENCOUNTER — Telehealth: Payer: Self-pay
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
## 2017-12-31 ENCOUNTER — Encounter: Payer: Self-pay | Admitting: Obstetrics and Gynecology

## 2017-12-31 ENCOUNTER — Ambulatory Visit: Payer: BLUE CROSS/BLUE SHIELD | Admitting: Obstetrics and Gynecology

## 2017-12-31 VITALS — BP 126/84 | HR 78 | Ht 63.5 in | Wt 301.0 lb

## 2017-12-31 DIAGNOSIS — N3000 Acute cystitis without hematuria: Secondary | ICD-10-CM

## 2017-12-31 DIAGNOSIS — R300 Dysuria: Secondary | ICD-10-CM | POA: Diagnosis not present

## 2017-12-31 LAB — POCT URINALYSIS DIPSTICK
Bilirubin, UA: NEGATIVE
Glucose, UA: NEGATIVE
Ketones, UA: NEGATIVE
NITRITE UA: NEGATIVE
PH UA: 6 (ref 5.0–8.0)
Protein, UA: NEGATIVE
RBC UA: NEGATIVE
SPEC GRAV UA: 1.015 (ref 1.010–1.025)

## 2017-12-31 MED ORDER — NITROFURANTOIN MONOHYD MACRO 100 MG PO CAPS
100.0000 mg | ORAL_CAPSULE | Freq: Two times a day (BID) | ORAL | 0 refills | Status: AC
Start: 2017-12-31 — End: 2018-01-07

## 2017-12-31 NOTE — Patient Instructions (Signed)
I value your feedback and entrusting us with your care. If you get a [facility] patient survey, I would appreciate you taking the time to let us know about your experience today. Thank you! 

## 2017-12-31 NOTE — Progress Notes (Signed)
[name], [name], MD   Chief Complaint  Patient presents with  . Urinary Tract Infection    burning sensation when urinates, has seen blood when wipes, and now having thick discharge, no odor or itchiness x 2 weeks    HPI:      [name] is a 27 y.o. G2P1011 who LMP was No LMP recorded. (Menstrual status: IUD)., presents today for UTI sx of dysuria, hematuria off and on for the past 2 wks. No urinary frequency/urgency/LBP, belly pain, fevers. No vag bleeding. Has had increased d/c, without odor or irritation, but has noted mild irritation with sitting. Pt uses suave soap, no dryer sheets. No meds to treat. No recent abx use. Pt is sex active, no new partners.    Past Medical History:  Diagnosis Date  . Calculus of kidney 2006  . Depression   . Migraine   . Obesity     Past Surgical History:  Procedure Laterality Date  . TONSILLECTOMY AND ADENOIDECTOMY    . Tubes in ears    . WRIST SURGERY Bilateral     Family History  Problem Relation Age of Onset  . Healthy Father   . Diabetes Mother   . Cancer Maternal Grandmother   . Heart disease Maternal Grandmother   . Hypertension Maternal Grandmother   . Rheum arthritis Paternal Grandmother   . Cancer Other        Colon, Descending, Malignant  . Cancer Other        Colon, Transverse, Malignant    Social History   Socioeconomic History  . Marital status: Single    Spouse name: Not on file  . Number of children: 1  . Years of education: Trade Sch  . Highest education level: Not on file  Occupational History  . Occupation: Chartered certified accountantervice Writer  Social Needs  . Financial resource strain: Not on file  . Food insecurity:    Worry: Not on file    Inability: Not on file  . Transportation needs:    Medical: Not on file    Non-medical: Not on file  Tobacco Use  . Smoking status: Never Smoker  . Smokeless tobacco: Never Used  Substance and Sexual Activity  . Alcohol use: Yes    Comment: occasional   . Drug use:  No  . Sexual activity: Yes    Birth control/protection: IUD    Comment: Mirena  Lifestyle  . Physical activity:    Days per week: Not on file    Minutes per session: Not on file  . Stress: Not on file  Relationships  . Social connections:    Talks on phone: Not on file    Gets together: Not on file    Attends religious service: Not on file    Active member of club or organization: Not on file    Attends meetings of clubs or organizations: Not on file    Relationship status: Not on file  . Intimate partner violence:    Fear of current or ex partner: Not on file    Emotionally abused: Not on file    Physically abused: Not on file    Forced sexual activity: Not on file  Other Topics Concern  . Not on file  Social History Narrative   Lives at home with son.   Right-handed.   1 cup caffeine per day.    Outpatient Medications Prior to Visit  Medication Sig Dispense Refill  . levonorgestrel (MIRENA) 20 MCG/24HR IUD  1 each by Intrauterine route once.    [name] acetaminophen (TYLENOL) 325 MG tablet Take 2 tablets (650 mg total) by mouth every 6 (six) hours as needed for headache (or Fever >/= 101). (Patient not taking: Reported on 04/11/2017) 30 tablet 0  . ibuprofen (ADVIL,MOTRIN) 800 MG tablet Take 800 mg by mouth every 8 (eight) hours as needed.    . Naltrexone-buPROPion HCl ER (CONTRAVE) 8-90 MG TB12 1 tablet po once daily for 7 days, 1 tablet po bid for 7 days,  2 tablets po every morning and 1 tablet po every evening for 7 days, then 2 tablets po bid maintenance 84 tablet 0  . Naltrexone-buPROPion HCl ER (CONTRAVE) 8-90 MG TB12 Take 2 tablets by mouth 2 (two) times daily. 120 tablet 1  . phentermine (ADIPEX-P) 37.5 MG tablet Take 0.5 tablets (18.75 mg total) by mouth daily before breakfast. 15 tablet 0  . rizatriptan (MAXALT-MLT) 10 MG disintegrating tablet Take 1 tablet (10 mg total) by mouth as needed. May repeat in 2 hours if needed 15 tablet 6   No facility-administered medications  prior to visit.      ROS:  Review of Systems  Constitutional: Negative for fever.  Gastrointestinal: Negative for blood in stool, constipation, diarrhea, nausea and vomiting.  Genitourinary: Positive for dysuria and vaginal discharge. Negative for dyspareunia, flank pain, frequency, hematuria, urgency, vaginal bleeding and vaginal pain.  Musculoskeletal: Negative for back pain.  Skin: Negative for rash.    OBJECTIVE:   Vitals:  BP 126/84   Pulse 78   Ht 5' 3.5" (1.613 m)   Wt (!) 301 lb (136.5 kg)   BMI 52.48 kg/m   Physical Exam  Constitutional: She is oriented to person, place, and time. She appears well-developed.  Neck: Normal range of motion.  Pulmonary/Chest: Effort normal.  Neurological: She is alert and oriented to person, place, and time. No cranial nerve deficit.  Psychiatric: She has a normal mood and affect. Her behavior is normal. Judgment and thought content normal.  Vitals reviewed.   Results: Results for orders placed or performed in visit on 12/31/17 (from the past 24 hour(s))  POCT Urinalysis Dipstick     Status: Abnormal   Collection Time: 12/31/17  9:48 AM  Result Value Ref Range   Color, UA yellow    Clarity, UA clear    Glucose, UA Negative Negative   Bilirubin, UA neg    Ketones, UA neg    Spec Grav, UA 1.015 1.010 - 1.025   Blood, UA neg    pH, UA 6.0 5.0 - 8.0   Protein, UA Negative Negative   Urobilinogen, UA     Nitrite, UA neg    Leukocytes, UA Large (3+) (A) Negative   Appearance     Odor      Assessment/Plan: Acute cystitis without hematuria - Pos sx/UA. Check C&S. Rx macrobid. Will call with results. If C&S neg and still sx, question vag etiology.  - Plan: Urine Culture, nitrofurantoin, macrocrystal-monohydrate, (MACROBID) 100 MG capsule, POCT Urinalysis Dipstick  Dysuria - Treat for UTI. If neg, question vag etiology. Dove sens skin soap. F/u prn.     Meds ordered this encounter  Medications  . nitrofurantoin,  macrocrystal-monohydrate, (MACROBID) 100 MG capsule    Sig: Take 1 capsule (100 mg total) by mouth 2 (two) times daily for 7 days.    Dispense:  14 capsule    Refill:  0    Order Specific Question:   Supervising Provider  Answer:   Nadara Mustard [[id number]]      Return if symptoms worsen or fail to improve.  Eustace Hur B. Brittie Whisnant, PA-C 12/31/2017 9:50 AM

## 2018-01-02 LAB — URINE CULTURE

## 2018-12-10 ENCOUNTER — Other Ambulatory Visit: Payer: Self-pay

## 2018-12-10 ENCOUNTER — Encounter: Payer: Self-pay | Admitting: Obstetrics and Gynecology

## 2018-12-10 ENCOUNTER — Other Ambulatory Visit: Payer: Self-pay | Admitting: Obstetrics and Gynecology

## 2018-12-10 ENCOUNTER — Ambulatory Visit (INDEPENDENT_AMBULATORY_CARE_PROVIDER_SITE_OTHER): Payer: BC Managed Care – PPO | Admitting: Obstetrics and Gynecology

## 2018-12-10 VITALS — BP 122/78 | HR 102 | Ht 63.0 in | Wt 321.0 lb

## 2018-12-10 DIAGNOSIS — B373 Candidiasis of vulva and vagina: Secondary | ICD-10-CM

## 2018-12-10 DIAGNOSIS — Z01419 Encounter for gynecological examination (general) (routine) without abnormal findings: Secondary | ICD-10-CM | POA: Diagnosis not present

## 2018-12-10 DIAGNOSIS — B3731 Acute candidiasis of vulva and vagina: Secondary | ICD-10-CM

## 2018-12-10 DIAGNOSIS — Z113 Encounter for screening for infections with a predominantly sexual mode of transmission: Secondary | ICD-10-CM

## 2018-12-10 DIAGNOSIS — Z1239 Encounter for other screening for malignant neoplasm of breast: Secondary | ICD-10-CM

## 2018-12-10 MED ORDER — FLUCONAZOLE 150 MG PO TABS: 150.0000 mg | ORAL_TABLET | Freq: Once | ORAL | 0 refills | Status: AC

## 2018-12-10 MED ORDER — PHENTERMINE HCL 37.5 MG PO TABS: 37.5000 mg | ORAL_TABLET | Freq: Every day | ORAL | 0 refills | Status: DC

## 2018-12-10 NOTE — Progress Notes (Addendum)
Gynecology Annual Exam   PCP: Malachy Mood, MD  Chief Complaint:  Chief Complaint  Patient presents with  . Gynecologic Exam    Weight loss, talk about removing IUD     History of Present Illness: Patient is a 28 y.o. G2P1011 presents for annual exam. The patient has no complaints today.   LMP: No LMP recorded. (Menstrual status: IUD). Absent on Mirena IUD  The patient is sexually active. She currently uses IUD for contraception. She denies dyspareunia.  The patient does perform self breast exams.  There is no notable family history of breast or ovarian cancer in her family.  The patient wears seatbelts: yes.   The patient has regular exercise: not asked.    The patient denies current symptoms of depression.     Patientis a 28 y.o. G8P1011 female, who presents for the evaluation of weight gain. She has gained 16 pounds primarily over 12 months. The patient states the following issues have contributed to her weight problem: lifetstyle.  The patient has no additional symptoms. The patient specifically denies memory loss, muscle weakness, excessive thirst, and polyuria. Weight related co-morbidities include none, negative work up for cushing's and hypothyroidism in past.  She has tried phentermine interventions in the past with moderate success.   Review of Systems: Review of Systems  Constitutional: Negative for chills and fever.  HENT: Negative for congestion.   Respiratory: Negative for cough and shortness of breath.   Cardiovascular: Negative for chest pain and palpitations.  Gastrointestinal: Negative for abdominal pain, constipation, diarrhea, heartburn, nausea and vomiting.  Genitourinary: Negative for dysuria, frequency and urgency.  Skin: Negative for itching and rash.  Neurological: Negative for dizziness and headaches.  Endo/Heme/Allergies: Negative for polydipsia.  Psychiatric/Behavioral: Negative for depression.    Past Medical History:  Past Medical  History:  Diagnosis Date  . Calculus of kidney 2006  . Depression   . Migraine   . Obesity     Past Surgical History:  Past Surgical History:  Procedure Laterality Date  . TONSILLECTOMY AND ADENOIDECTOMY    . Tubes in ears    . WRIST SURGERY Bilateral     Gynecologic History:  No LMP recorded. (Menstrual status: IUD). Contraception: IUD  Obstetric History: G2P1011  Family History:  Family History  Problem Relation Age of Onset  . Healthy Father   . Diabetes Mother   . Cancer Maternal Grandmother   . Heart disease Maternal Grandmother   . Hypertension Maternal Grandmother   . Rheum arthritis Paternal Grandmother   . Cancer Other        Colon, Descending, Malignant  . Cancer Other        Colon, Transverse, Malignant    Social History:  Social History   Socioeconomic History  . Marital status: Single    Spouse name: Not on file  . Number of children: 1  . Years of education: Trade Sch  . Highest education level: Not on file  Occupational History  . Occupation: Nature conservation officer  Social Needs  . Financial resource strain: Not on file  . Food insecurity    Worry: Not on file    Inability: Not on file  . Transportation needs    Medical: Not on file    Non-medical: Not on file  Tobacco Use  . Smoking status: Never Smoker  . Smokeless tobacco: Never Used  Substance and Sexual Activity  . Alcohol use: Yes    Comment: occasional   . Drug use: No  .  Sexual activity: Yes    Birth control/protection: I.U.D.    Comment: Mirena  Lifestyle  . Physical activity    Days per week: Not on file    Minutes per session: Not on file  . Stress: Not on file  Relationships  . Social Musician on phone: Not on file    Gets together: Not on file    Attends religious service: Not on file    Active member of club or organization: Not on file    Attends meetings of clubs or organizations: Not on file    Relationship status: Not on file  . Intimate partner  violence    Fear of current or ex partner: Not on file    Emotionally abused: Not on file    Physically abused: Not on file    Forced sexual activity: Not on file  Other Topics Concern  . Not on file  Social History Narrative   Lives at home with son.   Right-handed.   1 cup caffeine per day.    Allergies:  Allergies  Allergen Reactions  . Sulfa Antibiotics Other (See Comments)    Medications: Prior to Admission medications   Medication Sig Start Date End Date Taking? Authorizing Provider  levonorgestrel (MIRENA) 20 MCG/24HR IUD 1 each by Intrauterine route once.   Yes [provider]    Physical Exam Vitals: Blood pressure 122/78, pulse (!) 102, height 5\' 3"  (1.6 m), weight (!) 321 lb (145.6 kg).  General: NAD HEENT: normocephalic, anicteric Thyroid: no enlargement, no palpable nodules Pulmonary: No increased work of breathing, CTAB Cardiovascular: RRR, distal pulses 2+ Breast: Breast symmetrical, no tenderness, no palpable nodules or masses, no skin or nipple retraction present, no nipple discharge.  No axillary or supraclavicular lymphadenopathy. Abdomen: NABS, soft, non-tender, non-distended.  Umbilicus without lesions.  No hepatomegaly, splenomegaly or masses palpable. No evidence of hernia  Genitourinary:  External: Normal external female genitalia.  Normal urethral meatus, normal Bartholin's and Skene's glands.    Vagina: Normal vaginal mucosa, no evidence of prolapse.    Cervix: Grossly normal in appearance, no bleeding  Uterus: Non-enlarged, mobile, normal contour.  No CMT  Adnexa: ovaries non-enlarged, no adnexal masses  Rectal: deferred  Lymphatic: no evidence of inguinal lymphadenopathy Extremities: no edema, erythema, or tenderness Neurologic: Grossly intact Psychiatric: mood appropriate, affect full  Female chaperone present for pelvic and breast  portions of the physical exam    Assessment: 28 y.o. G2P1011 routine annual exam  Plan:  Problem List Items Addressed This Visit    None    Visit Diagnoses    Candida vaginitis    -  Primary   Relevant Orders   NuSwab Vaginitis Plus (VG+)   Routine screening for STI (sexually transmitted infection)       Relevant Orders   NuSwab Vaginitis Plus (VG+)   Encounter for gynecological examination without abnormal finding       Breast screening         Annual  1) STI screening  was not offered and therefore not obtained  2)  ASCCP guidelines and rational discussed.  Patient opts for every 3 years screening interval  3) Contraception - the patient is currently using  IUD.  She is happy with her current form of contraception and plans to continue - need replacement or removal of IUD  4) Routine healthcare maintenance including cholesterol, diabetes screening discussed managed by PCP  5) Vaginitis symptoms - Nuswab sent  6) Return  in about 4 weeks (around 01/07/2019) for weight check.   Obesity  1) 1500 Calorie ADA Diet  2) Patient education given regarding appropriate lifestyle changes for weight loss including: regular physical activity, healthy coping strategies, caloric restriction and healthy eating patterns.  3) Patient will be started on weight loss medication. The risks and benefits and side effects of medication, such as Adipex (Phenteramine) ,  Tenuate (Diethylproprion), Belviq (lorcarsin), Contrave (buproprion/naltrexone), Qsymia (phentermine/topiramate), and Saxenda (liraglutide) is discussed. The pros and cons of suppressing appetite and boosting metabolism is discussed. Risks of tolerence and addiction is discussed for selected agents discussed. Use of medicine will ne short term, such as 3-4 months at a time followed by a period of time off of the medicine to avoid these risks and side effects for Adipex, Qsymia, and Tenuate discussed. Pt to call with any negative side effects and agrees to keep follow up appts.  4) Comorbidity Screening - hypothyroidism  screening, diabetes, and hyperlipidemia screening offered  5) Encouraged weekly weight monitorig to track progress and sample 1 week food diary  6) Contraception - discussed that all weight loss drugs fall in to pregnancy category X, patient currently has reliable contraception in the form of IUD  8) Follow up in 4 weeks to assess response     Vena AustriaAndreas Bobbie Valletta, MD, Merlinda FrederickFACOG [facility], [facility] 12/10/2018, 3:50 PM

## 2018-12-13 LAB — NUSWAB VAGINITIS PLUS (VG+)
Atopobium vaginae: HIGH Score — AB
Candida albicans, NAA: NEGATIVE
Candida glabrata, NAA: NEGATIVE
Chlamydia trachomatis, NAA: NEGATIVE
Megasphaera 1: HIGH Score — AB
Neisseria gonorrhoeae, NAA: NEGATIVE
Trich vag by NAA: POSITIVE — AB

## 2018-12-15 ENCOUNTER — Other Ambulatory Visit: Payer: Self-pay | Admitting: Obstetrics and Gynecology

## 2018-12-15 ENCOUNTER — Telehealth: Payer: Self-pay | Admitting: Obstetrics and Gynecology

## 2018-12-15 MED ORDER — METRONIDAZOLE 500 MG PO TABS: 500.0000 mg | ORAL_TABLET | Freq: Two times a day (BID) | ORAL | 0 refills | Status: DC

## 2018-12-15 NOTE — Telephone Encounter (Signed)
-----   Message from Malachy Mood, MD sent at 12/12/2018  6:56 PM EDT ----- Regarding: IUD removal/replacement IUD removal and replacement in the next 1-2 weeks   Malachy Mood, MD, [place], [place] 12/12/2018, 6:57 PM

## 2018-12-15 NOTE — Telephone Encounter (Signed)
Called and spoke with Patient to schedule an IUD replacement. Patient was already schedule for 01/07/19. Patient wishes to just come in on that already schedule date to have IUD removed.

## 2018-12-15 NOTE — Telephone Encounter (Signed)
Patient scheduled 11/25 for Mirena replacement with AMS

## 2018-12-16 NOTE — Telephone Encounter (Signed)
Noted. Will order to arrive by apt date/time. 

## 2018-12-24 ENCOUNTER — Other Ambulatory Visit: Payer: Self-pay

## 2018-12-24 DIAGNOSIS — Z20822 Contact with and (suspected) exposure to covid-19: Secondary | ICD-10-CM

## 2018-12-26 ENCOUNTER — Telehealth: Payer: Self-pay | Admitting: Obstetrics and Gynecology

## 2018-12-26 LAB — NOVEL CORONAVIRUS, NAA: SARS-CoV-2, NAA: DETECTED — AB

## 2018-12-26 NOTE — Telephone Encounter (Signed)
Patient is calling with a positive Covid 19 test that was taken on 12/24/18. Patient is requesting telephone visit for 01/07/19 with AMS. Patient will reschedule for IUD replacement when she is symptom free.

## 2019-01-02 NOTE — Telephone Encounter (Signed)
Patient rescheduled weight check/iud removal together 12/8

## 2019-01-07 ENCOUNTER — Ambulatory Visit: Payer: BC Managed Care – PPO | Admitting: Obstetrics and Gynecology

## 2019-01-20 ENCOUNTER — Other Ambulatory Visit: Payer: Self-pay

## 2019-01-20 ENCOUNTER — Encounter: Payer: Self-pay | Admitting: Obstetrics and Gynecology

## 2019-01-20 ENCOUNTER — Ambulatory Visit (INDEPENDENT_AMBULATORY_CARE_PROVIDER_SITE_OTHER): Payer: BC Managed Care – PPO | Admitting: Obstetrics and Gynecology

## 2019-01-20 VITALS — BP 120/88 | Ht 63.0 in | Wt 314.0 lb

## 2019-01-20 DIAGNOSIS — Z30432 Encounter for removal of intrauterine contraceptive device: Secondary | ICD-10-CM | POA: Diagnosis not present

## 2019-01-20 DIAGNOSIS — Z6841 Body Mass Index (BMI) 40.0 and over, adult: Secondary | ICD-10-CM

## 2019-01-20 MED ORDER — PHENTERMINE HCL 37.5 MG PO TABS: 37.5000 mg | ORAL_TABLET | Freq: Every day | ORAL | 0 refills | Status: DC

## 2019-01-20 NOTE — Progress Notes (Signed)
Gynecology Office Visit  Chief Complaint:  Chief Complaint  Patient presents with  . Weight Check  . IUD removal    History of Present Illness: Patientis a 28 y.o. G63P1011 female, who presents for the evaluation of the desire to lose weight. She has lost 7 pounds 1 months. The patient states the following symptoms since starting her weight loss therapy: appetite suppression, energy, and weight loss.  The patient also reports no other ill effects. The patient specifically denies heart palpitations, anxiety, and insomnia.    Review of Systems: 10 point review of systems negative unless otherwise noted in HPI  Past Medical History:  Past Medical History:  Diagnosis Date  . Calculus of kidney 2006  . Depression   . Migraine   . Obesity     Past Surgical History:  Past Surgical History:  Procedure Laterality Date  . TONSILLECTOMY AND ADENOIDECTOMY    . Tubes in ears    . WRIST SURGERY Bilateral     Gynecologic History: No LMP recorded. (Menstrual status: IUD).  Obstetric History: G2P1011  Family History:  Family History  Problem Relation Age of Onset  . Healthy Father   . Diabetes Mother   . Cancer Maternal Grandmother   . Heart disease Maternal Grandmother   . Hypertension Maternal Grandmother   . Rheum arthritis Paternal Grandmother   . Cancer Other        Colon, Descending, Malignant  . Cancer Other        Colon, Transverse, Malignant    Social History:  Social History   Socioeconomic History  . Marital status: Single    Spouse name: Not on file  . Number of children: 1  . Years of education: Trade Sch  . Highest education level: Not on file  Occupational History  . Occupation: Chartered certified accountant  Social Needs  . Financial resource strain: Not on file  . Food insecurity    Worry: Not on file    Inability: Not on file  . Transportation needs    Medical: Not on file    Non-medical: Not on file  Tobacco Use  . Smoking status: Never Smoker  .  Smokeless tobacco: Never Used  Substance and Sexual Activity  . Alcohol use: Yes    Comment: occasional   . Drug use: No  . Sexual activity: Yes    Birth control/protection: I.U.D.    Comment: Mirena  Lifestyle  . Physical activity    Days per week: Not on file    Minutes per session: Not on file  . Stress: Not on file  Relationships  . Social Musician on phone: Not on file    Gets together: Not on file    Attends religious service: Not on file    Active member of club or organization: Not on file    Attends meetings of clubs or organizations: Not on file    Relationship status: Not on file  . Intimate partner violence    Fear of current or ex partner: Not on file    Emotionally abused: Not on file    Physically abused: Not on file    Forced sexual activity: Not on file  Other Topics Concern  . Not on file  Social History Narrative   Lives at home with son.   Right-handed.   1 cup caffeine per day.    Allergies:  Allergies  Allergen Reactions  . Sulfa Antibiotics Other (See Comments)  Medications: Prior to Admission medications   Medication Sig Start Date End Date Taking? Authorizing Provider  levonorgestrel (MIRENA) 20 MCG/24HR IUD 1 each by Intrauterine route once.    [provider]  metroNIDAZOLE (FLAGYL) 500 MG tablet Take 1 tablet (500 mg total) by mouth 2 (two) times daily. 12/15/18   Malachy Mood, MD  phentermine (ADIPEX-P) 37.5 MG tablet Take 1 tablet (37.5 mg total) by mouth daily before breakfast. 01/20/19   Malachy Mood, MD    Physical Exam Blood pressure 120/88, height 5\' 3"  (1.6 m), weight (!) 314 lb (142.4 kg). Wt Readings from Last 3 Encounters:  01/20/19 (!) 314 lb (142.4 kg)  12/10/18 (!) 321 lb (145.6 kg)  12/31/17 (!) 301 lb (136.5 kg)  Body mass index is 55.62 kg/m.   General: NAD HEENT: normocephalic, anicteric Thyroid: no enlargement Pulmonary: no increased work of breathing Neurologic: Grossly   Psychiatric: mood appropriate, affect full    GYNECOLOGY OFFICE PROCEDURE NOTE  ISHITHA ROPER is a 28 y.o. G2P1011 here for  Mirena  IUD removal placed 7 years ago. She desires removal secondary to contemplating pregnancy in the next year.  IUD Removal  Patient identified, informed consent performed, consent signed.  Patient was in the dorsal lithotomy position, normal external genitalia was noted.  A speculum was placed in the patient's vagina, normal discharge was noted, no lesions. The cervix was visualized, no lesions, no abnormal discharge.  The strings of the IUD were grasped and pulled using ring forceps. The IUD was removed in its entirety. Patient tolerated the procedure well.    Patient will use plans for pregnancy soon and she was told to avoid teratogens, take PNV and folic acid.  Routine preventative health maintenance measures emphasized.   Assessment: 28 y.o. G2P1011 weight loss follow up Mirena IUD removal  Plan: Problem List Items Addressed This Visit    None    Visit Diagnoses    Class 3 severe obesity without serious comorbidity with body mass index (BMI) of 50.0 to 59.9 in adult, unspecified obesity type ([place])    -  Primary   Relevant Medications   phentermine (ADIPEX-P) 37.5 MG tablet   Encounter for IUD removal          1) 1500 Calorie ADA Diet  2) Patient education given regarding appropriate lifestyle changes for weight loss including: regular physical activity, healthy coping strategies, caloric restriction and healthy eating patterns.  3) Patient will be started on weight loss medication. The risks and benefits and side effects of medication, such as Adipex (Phenteramine) ,  Tenuate (Diethylproprion), Belviq (lorcarsin), Contrave (buproprion/naltrexone), Qsymia (phentermine/topiramate), and Saxenda (liraglutide) is discussed. The pros and cons of suppressing appetite and boosting metabolism is discussed. Risks of tolerence and addiction is discussed for  selected agents discussed. Use of medicine will ne short term, such as 3-4 months at a time followed by a period of time off of the medicine to avoid these risks and side effects for Adipex, Qsymia, and Tenuate discussed. Pt to call with any negative side effects and agrees to keep follow up appts.  4) Patient to take medication, with the benefits of appetite suppression and metabolism boost d/w pt, along with the side effects and risk factors of long term use that will be avoided with our use of short bursts of therapy. Rx provided.    5) 15 minutes face-to-face; with counseling/coordination of care > 50 percent of visit related to obesity and ongoing management/treatment   6)  Return  in about 4 weeks (around 02/17/2019) for medication follow up phone ok.    Vena AustriaAndreas Pebbles Zeiders, MD, Merlinda FrederickFACOG [facility], [facility]

## 2019-02-19 ENCOUNTER — Other Ambulatory Visit: Payer: Self-pay

## 2019-02-19 ENCOUNTER — Ambulatory Visit (INDEPENDENT_AMBULATORY_CARE_PROVIDER_SITE_OTHER): Payer: BC Managed Care – PPO | Admitting: Obstetrics and Gynecology

## 2019-02-19 ENCOUNTER — Encounter: Payer: Self-pay | Admitting: Obstetrics and Gynecology

## 2019-02-19 VITALS — Ht 63.0 in | Wt 307.0 lb

## 2019-02-19 DIAGNOSIS — Z6841 Body Mass Index (BMI) 40.0 and over, adult: Secondary | ICD-10-CM | POA: Diagnosis not present

## 2019-02-19 MED ORDER — PHENTERMINE HCL 37.5 MG PO TABS: 37.5000 mg | ORAL_TABLET | Freq: Every day | ORAL | 0 refills | Status: DC

## 2019-02-19 NOTE — Progress Notes (Signed)
I connected with Christine Hooper on 02/19/19 at  8:10 AM EST by telephone and verified that I am speaking with the correct person using two identifiers.   I discussed the limitations, risks, security and privacy concerns of performing an evaluation and management service by telephone and the availability of in person appointments. I also discussed with the patient that there may be a patient responsible charge related to this service. The patient expressed understanding and agreed to proceed.  The patient was at home I spoke with the patient from my workstation phone The names of people involved in this encounter were: Melvyn Neth , and Malachy Mood   Gynecology Office Visit  Chief Complaint:  Chief Complaint  Patient presents with  . Follow-up    History of Present Illness: Patientis a 29 y.o. G53P1011 female, who presents for the evaluation of the desire to lose weight. She has lost 7 pounds 1 months. 14lbs in 2 months. The patient states the following symptoms since starting her weight loss therapy: appetite suppression, energy, and weight loss.  The patient also reports no other ill effects. The patient specifically denies heart palpitations, anxiety, and insomnia.   Has started protein shakes as a meal substitute and feels these have been working well.  Still only taking 1/2 dose with good effect.   Review of Systems: 10 point review of systems negative unless otherwise noted in HPI  Past Medical History:  Past Medical History:  Diagnosis Date  . Calculus of kidney 2006  . Depression   . Migraine   . Obesity     Past Surgical History:  Past Surgical History:  Procedure Laterality Date  . TONSILLECTOMY AND ADENOIDECTOMY    . Tubes in ears    . WRIST SURGERY Bilateral     Gynecologic History: No LMP recorded. (Menstrual status: IUD).  Obstetric History: G2P1011  Family History:  Family History  Problem Relation Age of Onset  . Healthy Father   . Diabetes  Mother   . Cancer Maternal Grandmother   . Heart disease Maternal Grandmother   . Hypertension Maternal Grandmother   . Rheum arthritis Paternal Grandmother   . Cancer Other        Colon, Descending, Malignant  . Cancer Other        Colon, Transverse, Malignant    Social History:  Social History   Socioeconomic History  . Marital status: Single    Spouse name: Not on file  . Number of children: 1  . Years of education: Trade Sch  . Highest education level: Not on file  Occupational History  . Occupation: Nature conservation officer  Tobacco Use  . Smoking status: Never Smoker  . Smokeless tobacco: Never Used  Substance and Sexual Activity  . Alcohol use: Yes    Comment: occasional   . Drug use: No  . Sexual activity: Yes    Birth control/protection: I.U.D.    Comment: Mirena  Other Topics Concern  . Not on file  Social History Narrative   Lives at home with son.   Right-handed.   1 cup caffeine per day.   Social Determinants of Health   Financial Resource Strain:   . Difficulty of Paying Living Expenses: Not on file  Food Insecurity:   . Worried About Charity fundraiser in the Last Year: Not on file  . Ran Out of Food in the Last Year: Not on file  Transportation Needs:   . Lack of Transportation (Medical):  Not on file  . Lack of Transportation (Non-Medical): Not on file  Physical Activity:   . Days of Exercise per Week: Not on file  . Minutes of Exercise per Session: Not on file  Stress:   . Feeling of Stress : Not on file  Social Connections:   . Frequency of Communication with Friends and Family: Not on file  . Frequency of Social Gatherings with Friends and Family: Not on file  . Attends Religious Services: Not on file  . Active Member of Clubs or Organizations: Not on file  . Attends Banker Meetings: Not on file  . Marital Status: Not on file  Intimate Partner Violence:   . Fear of Current or Ex-Partner: Not on file  . Emotionally Abused: Not on  file  . Physically Abused: Not on file  . Sexually Abused: Not on file    Allergies:  Allergies  Allergen Reactions  . Sulfa Antibiotics Other (See Comments)    Medications: Prior to Admission medications   Medication Sig Start Date End Date Taking? Authorizing Provider  phentermine (ADIPEX-P) 37.5 MG tablet Take 1 tablet (37.5 mg total) by mouth daily before breakfast. 01/20/19  Yes Vena Austria, MD  levonorgestrel (MIRENA) 20 MCG/24HR IUD 1 each by Intrauterine route once.    [provider]  metroNIDAZOLE (FLAGYL) 500 MG tablet Take 1 tablet (500 mg total) by mouth 2 (two) times daily. Patient not taking: Reported on 02/19/2019 12/15/18   Vena Austria, MD    Physical Exam Height 5\' 3"  (1.6 m), weight (!) 307 lb (139.3 kg). Wt Readings from Last 3 Encounters:  02/19/19 (!) 307 lb (139.3 kg)  01/20/19 (!) 314 lb (142.4 kg)  12/10/18 (!) 321 lb (145.6 kg)  Body mass index is 54.38 kg/m.   No physical exam as this was a remote telephone visit to promote social distancing during the current COVID-19 Pandemic   Assessment: 29 y.o. G2P1011 follow medical weight loss Plan: Problem List Items Addressed This Visit    None    Visit Diagnoses    Class 3 severe obesity without serious comorbidity with body mass index (BMI) of 50.0 to 59.9 in adult, unspecified obesity type (HCC)    -  Primary   Relevant Medications   phentermine (ADIPEX-P) 37.5 MG tablet      1) 1500 Calorie ADA Diet  2) Patient education given regarding appropriate lifestyle changes for weight loss including: regular physical activity, healthy coping strategies, caloric restriction and healthy eating patterns.  4) Patient to take medication, with the benefits of appetite suppression and metabolism boost d/w pt, along with the side effects and risk factors of long term use that will be avoided with our use of short bursts of therapy. Rx provided.    5) 15 minutes face-to-face; with  counseling/coordination of care > 50 percent of visit related to obesity and ongoing management/treatment   6)  Return in about 4 weeks (around 03/19/2019) for medication follow up phone.    05/17/2019, MD, Vena Austria [facility], [facility] 02/19/2019, 8:29 AM

## 2019-03-19 ENCOUNTER — Telehealth: Payer: BC Managed Care – PPO | Admitting: Obstetrics and Gynecology

## 2019-03-20 ENCOUNTER — Telehealth (INDEPENDENT_AMBULATORY_CARE_PROVIDER_SITE_OTHER): Payer: BC Managed Care – PPO | Admitting: Obstetrics and Gynecology

## 2019-03-20 ENCOUNTER — Other Ambulatory Visit: Payer: Self-pay

## 2019-03-20 VITALS — Wt 298.0 lb

## 2019-03-20 DIAGNOSIS — Z713 Dietary counseling and surveillance: Secondary | ICD-10-CM | POA: Diagnosis not present

## 2019-03-20 DIAGNOSIS — Z6841 Body Mass Index (BMI) 40.0 and over, adult: Secondary | ICD-10-CM

## 2019-03-20 MED ORDER — PHENTERMINE HCL 37.5 MG PO TABS: 37.5000 mg | ORAL_TABLET | Freq: Every day | ORAL | 0 refills | Status: DC

## 2019-03-20 NOTE — Progress Notes (Signed)
Gynecology Office Visit (Video)  Chief Complaint: No chief complaint on file.   History of Present Illness: Patientis a 29 y.o. G68P1011 female, who presents for the evaluation of the desire to lose weight. She has lost 9 pounds 1 months. The patient states the following symptoms since starting her weight loss therapy: appetite suppression, energy, and weight loss.  The patient also reports no other ill effects. The patient specifically denies heart palpitations, anxiety, and insomnia.   Last two cycles regular monthly, 4-5 days, positive moliminal symptoms.  Goal weight 274 which is what she weighed prior there last pregnancy.   Review of Systems: 10 point review of systems negative unless otherwise noted in HPI  Past Medical History:  Past Medical History:  Diagnosis Date  . Calculus of kidney 2006  . Depression   . Migraine   . Obesity     Past Surgical History:  Past Surgical History:  Procedure Laterality Date  . TONSILLECTOMY AND ADENOIDECTOMY    . Tubes in ears    . WRIST SURGERY Bilateral     Gynecologic History: No LMP recorded. (Menstrual status: IUD).  Obstetric History: G2P1011  Family History:  Family History  Problem Relation Age of Onset  . Healthy Father   . Diabetes Mother   . Cancer Maternal Grandmother   . Heart disease Maternal Grandmother   . Hypertension Maternal Grandmother   . Rheum arthritis Paternal Grandmother   . Cancer Other        Colon, Descending, Malignant  . Cancer Other        Colon, Transverse, Malignant    Social History:  Social History   Socioeconomic History  . Marital status: Single    Spouse name: Not on file  . Number of children: 1  . Years of education: Trade Sch  . Highest education level: Not on file  Occupational History  . Occupation: Chartered certified accountant  Tobacco Use  . Smoking status: Never Smoker  . Smokeless tobacco: Never Used  Substance and Sexual Activity  . Alcohol use: Yes    Comment: occasional    . Drug use: No  . Sexual activity: Yes    Birth control/protection: I.U.D.    Comment: Mirena  Other Topics Concern  . Not on file  Social History Narrative   Lives at home with son.   Right-handed.   1 cup caffeine per day.   Social Determinants of Health   Financial Resource Strain:   . Difficulty of Paying Living Expenses: Not on file  Food Insecurity:   . Worried About Programme researcher, broadcasting/film/video in the Last Year: Not on file  . Ran Out of Food in the Last Year: Not on file  Transportation Needs:   . Lack of Transportation (Medical): Not on file  . Lack of Transportation (Non-Medical): Not on file  Physical Activity:   . Days of Exercise per Week: Not on file  . Minutes of Exercise per Session: Not on file  Stress:   . Feeling of Stress : Not on file  Social Connections:   . Frequency of Communication with Friends and Family: Not on file  . Frequency of Social Gatherings with Friends and Family: Not on file  . Attends Religious Services: Not on file  . Active Member of Clubs or Organizations: Not on file  . Attends Banker Meetings: Not on file  . Marital Status: Not on file  Intimate Partner Violence:   . Fear of Current  or Ex-Partner: Not on file  . Emotionally Abused: Not on file  . Physically Abused: Not on file  . Sexually Abused: Not on file    Allergies:  Allergies  Allergen Reactions  . Sulfa Antibiotics Other (See Comments)    Medications: Prior to Admission medications   Medication Sig Start Date End Date Taking? Authorizing Provider  levonorgestrel (MIRENA) 20 MCG/24HR IUD 1 each by Intrauterine route once.    [provider]  metroNIDAZOLE (FLAGYL) 500 MG tablet Take 1 tablet (500 mg total) by mouth 2 (two) times daily. Patient not taking: Reported on 02/19/2019 12/15/18   Malachy Mood, MD  phentermine (ADIPEX-P) 37.5 MG tablet Take 1 tablet (37.5 mg total) by mouth daily before breakfast. 02/19/19   Malachy Mood, MD     Physical Exam There were no vitals taken for this visit. Wt Readings from Last 3 Encounters:  03/20/19 298 lb (135.2 kg)  02/19/19 (!) 307 lb (139.3 kg)  01/20/19 (!) 314 lb (142.4 kg)  Body mass index is 52.79 kg/m.   General: NAD HEENT: normocephalic, anicteric Thyroid: no enlargement Pulmonary: no increased work of breathing Neurologic: Grossly intact Psychiatric: mood appropriate, affect full  Assessment: 29 y.o. G2P1011 follow up obesity Plan: Problem List Items Addressed This Visit    None    Visit Diagnoses    Class 3 severe obesity without serious comorbidity with body mass index (BMI) of 50.0 to 59.9 in adult, unspecified obesity type ([place])    -  Primary   Relevant Medications   phentermine (ADIPEX-P) 37.5 MG tablet      1) 1500 Calorie ADA Diet  2) Patient education given regarding appropriate lifestyle changes for weight loss including: regular physical activity, healthy coping strategies, caloric restriction and healthy eating patterns.  3) Patient will be started on weight loss medication. The risks and benefits and side effects of medication, such as Adipex (Phenteramine) ,  Tenuate (Diethylproprion), Belviq (lorcarsin), Contrave (buproprion/naltrexone), Qsymia (phentermine/topiramate), and Saxenda (liraglutide) is discussed. The pros and cons of suppressing appetite and boosting metabolism is discussed. Risks of tolerence and addiction is discussed for selected agents discussed. Use of medicine will ne short term, such as 3-4 months at a time followed by a period of time off of the medicine to avoid these risks and side effects for Adipex, Qsymia, and Tenuate discussed. Pt to call with any negative side effects and agrees to keep follow up appts.  4) Patient to take medication, with the benefits of appetite suppression and metabolism boost d/w pt, along with the side effects and risk factors of long term use that will be avoided with our use of short bursts of  therapy. Rx provided.    5) 15 minutes face-to-face via video; with counseling/coordination of care > 50 percent of visit related to obesity and ongoing management/treatment   6)  Return in about 4 weeks (around 04/17/2019) for medication follow up video.    Malachy Mood, MD, [facility], [facility] 03/20/2019, 9:21 AM

## 2019-04-17 ENCOUNTER — Telehealth (INDEPENDENT_AMBULATORY_CARE_PROVIDER_SITE_OTHER): Payer: BC Managed Care – PPO | Admitting: Obstetrics and Gynecology

## 2019-04-17 ENCOUNTER — Other Ambulatory Visit: Payer: Self-pay

## 2019-04-17 VITALS — Wt 298.0 lb

## 2019-04-17 DIAGNOSIS — Z6841 Body Mass Index (BMI) 40.0 and over, adult: Secondary | ICD-10-CM

## 2019-04-17 DIAGNOSIS — Z713 Dietary counseling and surveillance: Secondary | ICD-10-CM

## 2019-04-17 MED ORDER — PHENTERMINE HCL 37.5 MG PO TABS: 37.5000 mg | ORAL_TABLET | Freq: Every day | ORAL | 0 refills | Status: DC

## 2019-04-17 NOTE — Progress Notes (Signed)
I connected with Christine Hooper on 04/17/19 at  8:10 AM EST by video visit.   I discussed the limitations, risks, security and privacy concerns of performing an evaluation and management service by video and the availability of in person appointments. I also discussed with the patient that there may be a patient responsible charge related to this service. The patient expressed understanding and agreed to proceed.  The names of people involved in this encounter were: Julien Girt , and Vena Austria   Gynecology Office Visit  Chief Complaint: No chief complaint on file.   History of Present Illness: Patientis a 29 y.o. G41P1011 female, who presents for the evaluation of the desire to lose weight. She has lost 0 pounds 1 months, 23lbs total since December 2020. The patient states the following symptoms since starting her weight loss therapy: appetite suppression, energy, and weight loss.  The patient also reports no other ill effects. The patient specifically denies heart palpitations, anxiety, and insomnia.    Review of Systems: 10 point review of systems negative unless otherwise noted in HPI  Past Medical History:  Past Medical History:  Diagnosis Date  . Calculus of kidney 2006  . Depression   . Migraine   . Obesity     Past Surgical History:  Past Surgical History:  Procedure Laterality Date  . TONSILLECTOMY AND ADENOIDECTOMY    . Tubes in ears    . WRIST SURGERY Bilateral     Gynecologic History: No LMP recorded. (Menstrual status: IUD).  Obstetric History: G2P1011  Family History:  Family History  Problem Relation Age of Onset  . Healthy Father   . Diabetes Mother   . Cancer Maternal Grandmother   . Heart disease Maternal Grandmother   . Hypertension Maternal Grandmother   . Rheum arthritis Paternal Grandmother   . Cancer Other        Colon, Descending, Malignant  . Cancer Other        Colon, Transverse, Malignant    Social History:  Social History     Socioeconomic History  . Marital status: Single    Spouse name: Not on file  . Number of children: 1  . Years of education: Trade Sch  . Highest education level: Not on file  Occupational History  . Occupation: Chartered certified accountant  Tobacco Use  . Smoking status: Never Smoker  . Smokeless tobacco: Never Used  Substance and Sexual Activity  . Alcohol use: Yes    Comment: occasional   . Drug use: No  . Sexual activity: Yes    Birth control/protection: I.U.D.    Comment: Mirena  Other Topics Concern  . Not on file  Social History Narrative   Lives at home with son.   Right-handed.   1 cup caffeine per day.   Social Determinants of Health   Financial Resource Strain:   . Difficulty of Paying Living Expenses: Not on file  Food Insecurity:   . Worried About Programme researcher, broadcasting/film/video in the Last Year: Not on file  . Ran Out of Food in the Last Year: Not on file  Transportation Needs:   . Lack of Transportation (Medical): Not on file  . Lack of Transportation (Non-Medical): Not on file  Physical Activity:   . Days of Exercise per Week: Not on file  . Minutes of Exercise per Session: Not on file  Stress:   . Feeling of Stress : Not on file  Social Connections:   . Frequency  of Communication with Friends and Family: Not on file  . Frequency of Social Gatherings with Friends and Family: Not on file  . Attends Religious Services: Not on file  . Active Member of Clubs or Organizations: Not on file  . Attends Banker Meetings: Not on file  . Marital Status: Not on file  Intimate Partner Violence:   . Fear of Current or Ex-Partner: Not on file  . Emotionally Abused: Not on file  . Physically Abused: Not on file  . Sexually Abused: Not on file    Allergies:  Allergies  Allergen Reactions  . Sulfa Antibiotics Other (See Comments)    Medications: Prior to Admission medications   Medication Sig Start Date End Date Taking? Authorizing Provider  levonorgestrel (MIRENA)  20 MCG/24HR IUD 1 each by Intrauterine route once.    [provider]  metroNIDAZOLE (FLAGYL) 500 MG tablet Take 1 tablet (500 mg total) by mouth 2 (two) times daily. Patient not taking: Reported on 02/19/2019 12/15/18   Vena Austria, MD  phentermine (ADIPEX-P) 37.5 MG tablet Take 1 tablet (37.5 mg total) by mouth daily before breakfast. 03/20/19   Vena Austria, MD    Physical Exam There were no vitals taken for this visit. Wt Readings from Last 3 Encounters:  04/17/19 298 lb (135.2 kg)  03/20/19 298 lb (135.2 kg)  02/19/19 (!) 307 lb (139.3 kg)  Body mass index is 52.79 kg/m.   General: NAD, well nourished, appears stated age HEENT: normocephalic, anicteric Pulmonary: no increased work of breathing Neurologic: Grossly intact Psychiatric: mood appropriate, affect full  Assessment: 29 y.o. [id number] No problem-specific Assessment & Plan notes found for this encounter.   Plan: Problem List Items Addressed This Visit    None    Visit Diagnoses    Class 3 severe obesity without serious comorbidity with body mass index (BMI) of 50.0 to 59.9 in adult, unspecified obesity type (HCC)    -  Primary   Relevant Medications   phentermine (ADIPEX-P) 37.5 MG tablet      1) 1500 Calorie ADA Diet  2) Patient education given regarding appropriate lifestyle changes for weight loss including: regular physical activity, healthy coping strategies, caloric restriction and healthy eating patterns.  3) Patient will be started on weight loss medication. The risks and benefits and side effects of medication, such as Adipex (Phenteramine) ,  Tenuate (Diethylproprion), Belviq (lorcarsin), Contrave (buproprion/naltrexone), Qsymia (phentermine/topiramate), and Saxenda (liraglutide) is discussed. The pros and cons of suppressing appetite and boosting metabolism is discussed. Risks of tolerence and addiction is discussed for selected agents discussed. Use of medicine will ne short term, such as  3-4 months at a time followed by a period of time off of the medicine to avoid these risks and side effects for Adipex, Qsymia, and Tenuate discussed. Pt to call with any negative side effects and agrees to keep follow up appts.  4) Patient to take medication, with the benefits of appetite suppression and metabolism boost d/w pt, along with the side effects and risk factors of long term use that will be avoided with our use of short bursts of therapy. Rx provided.   - she has been taking 1/2 a pill thus far and we discussed moving up to 1 whole pill given plateau currently although patient had several birthdays and a trip to [place] in the past month  5) Is cycling regularly again and noticing moliminal symptoms and menstrual migraines.    6)  Return in about 4 weeks (  around 05/15/2019) for medication follow up video.    Malachy Mood, MD, [facility], [facility] 04/17/2019, 8:29 AM

## 2019-04-30 ENCOUNTER — Telehealth: Payer: Self-pay

## 2019-04-30 ENCOUNTER — Other Ambulatory Visit: Payer: Self-pay

## 2019-04-30 ENCOUNTER — Other Ambulatory Visit: Payer: Self-pay | Admitting: Obstetrics and Gynecology

## 2019-04-30 MED ORDER — PHENTERMINE HCL 37.5 MG PO TABS: 37.5000 mg | ORAL_TABLET | Freq: Every day | ORAL | 0 refills | Status: DC

## 2019-04-30 NOTE — Telephone Encounter (Signed)
Pt called triage stating that [facility] forgot to send in her phentermine at last visit. She wants to start it now. Please advise

## 2019-04-30 NOTE — Telephone Encounter (Signed)
I spoke to patient, She states when he called it in on 3/5. It was to soon for her to pick up so pharmacy told her she had to wait. I resent Rx. KJ CMA

## 2019-06-10 ENCOUNTER — Other Ambulatory Visit: Payer: Self-pay | Admitting: Obstetrics and Gynecology

## 2019-06-10 MED ORDER — FLUCONAZOLE 150 MG PO TABS: 150.0000 mg | ORAL_TABLET | Freq: Once | ORAL | 0 refills | Status: AC

## 2019-06-10 NOTE — Progress Notes (Signed)
diflu

## 2019-07-23 ENCOUNTER — Encounter: Payer: Self-pay | Admitting: Obstetrics and Gynecology

## 2019-07-23 ENCOUNTER — Other Ambulatory Visit (HOSPITAL_COMMUNITY)
Admission: RE | Admit: 2019-07-23 | Discharge: 2019-07-23 | Disposition: A | Discharge status: Home / Self Care | Payer: BC Managed Care – PPO | Source: Ambulatory Visit | Attending: Obstetrics and Gynecology | Admitting: Obstetrics and Gynecology

## 2019-07-23 ENCOUNTER — Other Ambulatory Visit: Payer: Self-pay

## 2019-07-23 ENCOUNTER — Ambulatory Visit (INDEPENDENT_AMBULATORY_CARE_PROVIDER_SITE_OTHER): Payer: BC Managed Care – PPO | Admitting: Obstetrics and Gynecology

## 2019-07-23 VITALS — BP 117/87 | Wt 287.0 lb

## 2019-07-23 DIAGNOSIS — Z6841 Body Mass Index (BMI) 40.0 and over, adult: Secondary | ICD-10-CM

## 2019-07-23 DIAGNOSIS — Z124 Encounter for screening for malignant neoplasm of cervix: Secondary | ICD-10-CM | POA: Diagnosis not present

## 2019-07-23 DIAGNOSIS — Z113 Encounter for screening for infections with a predominantly sexual mode of transmission: Secondary | ICD-10-CM | POA: Insufficient documentation

## 2019-07-23 MED ORDER — PHENTERMINE HCL 37.5 MG PO TABS: 37.5000 mg | ORAL_TABLET | Freq: Every day | ORAL | 0 refills | Status: DC

## 2019-07-23 NOTE — Progress Notes (Signed)
Obstetrics & Gynecology Office Visit   Chief Complaint:  Chief Complaint  Patient presents with  . STD check    History of Present Illness: Patient is a 29 y.o. G2P1011 presents for STD testing.  The patient has not noted intermenstrual spotting,  has not experienced postcoital bleeding, and does not report increased vaginal discharge.  There is a history of prior sexually transmitted infection(s).     The patient is sexually active and reports. She currently uses None for contraception. She never uses condoms or barrier methods to prevent the spread of sexually transmitted infections.  The patient has not been previously transfused or tattooed.    Patientis a 29 y.o. G60P1011 female, who presents for the evaluation of the desire to lose weight. She was previously on phentermine and would like to restart. The patient noted good weight loss on therapy.  The patient had no other ill effects. The patient specifically denied heart palpitations, anxiety, and insomnia during previous phentermine course.   Review of Systems: Review of Systems  Constitutional: Negative.   Genitourinary: Negative.   Skin: Negative.     Past Medical History:  Past Medical History:  Diagnosis Date  . Calculus of kidney 2006  . Depression   . Migraine   . Obesity     Past Surgical History:  Past Surgical History:  Procedure Laterality Date  . TONSILLECTOMY AND ADENOIDECTOMY    . Tubes in ears    . WRIST SURGERY Bilateral     Gynecologic History: Patient's last menstrual period was 07/06/2019 (exact date).  Obstetric History: G2P1011  Family History:  Family History  Problem Relation Age of Onset  . Healthy Father   . Diabetes Mother   . Cancer Maternal Grandmother   . Heart disease Maternal Grandmother   . Hypertension Maternal Grandmother   . Rheum arthritis Paternal Grandmother   . Cancer Other        Colon, Descending, Malignant  . Cancer Other        Colon, Transverse, Malignant      Social History:  Social History   Socioeconomic History  . Marital status: Single    Spouse name: Not on file  . Number of children: 1  . Years of education: Trade Sch  . Highest education level: Not on file  Occupational History  . Occupation: Nature conservation officer  Tobacco Use  . Smoking status: Never Smoker  . Smokeless tobacco: Never Used  Vaping Use  . Vaping Use: Never used  Substance and Sexual Activity  . Alcohol use: Yes    Comment: occasional   . Drug use: No  . Sexual activity: Yes    Birth control/protection: None    Comment: Mirena  Other Topics Concern  . Not on file  Social History Narrative   Lives at home with son.   Right-handed.   1 cup caffeine per day.   Social Determinants of Health   Financial Resource Strain:   . Difficulty of Paying Living Expenses:   Food Insecurity:   . Worried About Charity fundraiser in the Last Year:   . Arboriculturist in the Last Year:   Transportation Needs:   . Film/video editor (Medical):   [name] Lack of Transportation (Non-Medical):   Physical Activity:   . Days of Exercise per Week:   . Minutes of Exercise per Session:   Stress:   . Feeling of Stress :   Social Connections:   . Frequency  of Communication with Friends and Family:   . Frequency of Social Gatherings with Friends and Family:   . Attends Religious Services:   . Active Member of Clubs or Organizations:   . Attends Banker Meetings:   [name] Marital Status:   Intimate Partner Violence:   . Fear of Current or Ex-Partner:   . Emotionally Abused:   [name] Physically Abused:   . Sexually Abused:     Allergies:  Allergies  Allergen Reactions  . Sulfa Antibiotics Other (See Comments)    Medications: Prior to Admission medications   Medication Sig Start Date End Date Taking? Authorizing Provider  phentermine (ADIPEX-P) 37.5 MG tablet Take 1 tablet (37.5 mg total) by mouth daily before breakfast. 04/30/19  Yes Vena Austria, MD     Physical Exam Vitals:  Vitals:   07/23/19 1333  BP: 117/87   Patient's last menstrual period was 07/06/2019 (exact date). Body mass index is 50.84 kg/m.  General: NAD HEENT: normocephalic, anicteric Pulmonary: No increased work of breathing Genitourinary:  External: Normal external female genitalia.  Normal urethral meatus, normal  Bartholin's and Skene's glands.    Vagina: Normal vaginal mucosa, no evidence of prolapse.    Cervix: Grossly normal in appearance, no bleeding  Uterus: Non-enlarged, mobile, normal contour.  No CMT  Adnexa: ovaries non-enlarged, no adnexal masses  Rectal: deferred  Lymphatic: no evidence of inguinal lymphadenopathy Extremities: no edema, erythema, or tenderness Neurologic: Grossly intact Psychiatric: mood appropriate, affect full  Female chaperone present for pelvic  portions of the physical exam  Assessment: 29 y.o. G2P1011 STI screening  Plan: Problem List Items Addressed This Visit    None    Visit Diagnoses    Screening for malignant neoplasm of cervix    -  Primary   Relevant Orders   Cytology - PAP   Routine screening for STI (sexually transmitted infection)       Relevant Orders   Cytology - PAP   HEP, RPR, HIV Panel   Class 3 severe obesity without serious comorbidity with body mass index (BMI) of 50.0 to 59.9 in adult, unspecified obesity type (HCC)       Relevant Medications   phentermine (ADIPEX-P) 37.5 MG tablet     Interested in having IUD replaced to call with next cycle for placement  STI screening obtained today  Pap brought up to date  Restart phentermine  Vena Austria, MD, [facility], [facility] 07/23/2019, 1:37 PM

## 2019-07-24 LAB — HEP, RPR, HIV PANEL
HIV Screen 4th Generation wRfx: NONREACTIVE
Hepatitis B Surface Ag: NEGATIVE
RPR Ser Ql: NONREACTIVE

## 2019-07-27 LAB — CYTOLOGY - PAP
Chlamydia: NEGATIVE
Comment: NEGATIVE
Comment: NEGATIVE
Comment: NEGATIVE
Comment: NORMAL
Diagnosis: NEGATIVE
High risk HPV: NEGATIVE
Neisseria Gonorrhea: NEGATIVE
Trichomonas: NEGATIVE

## 2019-08-26 ENCOUNTER — Ambulatory Visit (INDEPENDENT_AMBULATORY_CARE_PROVIDER_SITE_OTHER): Payer: BC Managed Care – PPO | Admitting: Obstetrics and Gynecology

## 2019-08-26 ENCOUNTER — Other Ambulatory Visit: Payer: Self-pay

## 2019-08-26 ENCOUNTER — Encounter: Payer: Self-pay | Admitting: Obstetrics and Gynecology

## 2019-08-26 VITALS — Ht 63.0 in | Wt 288.2 lb

## 2019-08-26 DIAGNOSIS — Z6841 Body Mass Index (BMI) 40.0 and over, adult: Secondary | ICD-10-CM

## 2019-08-26 MED ORDER — PHENTERMINE HCL 37.5 MG PO TABS: 37.5000 mg | ORAL_TABLET | Freq: Every day | ORAL | 0 refills | Status: DC

## 2019-08-26 NOTE — Progress Notes (Signed)
I connected with Christine Hooper on 08/26/19 at  8:10 AM EDT by telephone and verified that I am speaking with the correct person using two identifiers.   I discussed the limitations, risks, security and privacy concerns of performing an evaluation and management service by telephone and the availability of in person appointments. I also discussed with the patient that there may be a patient responsible charge related to this service. The patient expressed understanding and agreed to proceed.  The patient was at home I spoke with the patient from my workstation phone The names of people involved in this encounter were: Julien Girt , and Vena Austria   Gynecology Office Visit  Chief Complaint:  Chief Complaint  Patient presents with  . Weight Check    after pt eats, pt gets flushed. discuss diabetes    History of Present Illness: Patientis a 29 y.o. G22P1011 female, who presents for the evaluation of the desire to lose weight. She has lost 0 pounds 1 months. The patient states the following symptoms since starting her weight loss therapy: appetite suppression, energy, and weight loss.  The patient also reports no other ill effects. The patient specifically denies heart palpitations, anxiety, and insomnia.   Review of Systems: 10 point review of systems negative unless otherwise noted in HPI  Past Medical History:  Past Medical History:  Diagnosis Date  . Calculus of kidney 2006  . Depression   . Migraine   . Obesity     Past Surgical History:  Past Surgical History:  Procedure Laterality Date  . TONSILLECTOMY AND ADENOIDECTOMY    . Tubes in ears    . WRIST SURGERY Bilateral     Gynecologic History: Patient's last menstrual period was 08/03/2019 (lmp unknown).  Obstetric History: G2P1011  Family History:  Family History  Problem Relation Age of Onset  . Healthy Father   . Diabetes Mother   . Cancer Maternal Grandmother   . Heart disease Maternal Grandmother    . Hypertension Maternal Grandmother   . Rheum arthritis Paternal Grandmother   . Cancer Other        Colon, Descending, Malignant  . Cancer Other        Colon, Transverse, Malignant    Social History:  Social History   Socioeconomic History  . Marital status: Single    Spouse name: Not on file  . Number of children: 1  . Years of education: Trade Sch  . Highest education level: Not on file  Occupational History  . Occupation: Chartered certified accountant  Tobacco Use  . Smoking status: Never Smoker  . Smokeless tobacco: Never Used  Vaping Use  . Vaping Use: Never used  Substance and Sexual Activity  . Alcohol use: Yes    Comment: occasional   . Drug use: No  . Sexual activity: Yes    Birth control/protection: None    Comment: Mirena  Other Topics Concern  . Not on file  Social History Narrative   Lives at home with son.   Right-handed.   1 cup caffeine per day.   Social Determinants of Health   Financial Resource Strain:   . Difficulty of Paying Living Expenses:   Food Insecurity:   . Worried About Programme researcher, broadcasting/film/video in the Last Year:   . Barista in the Last Year:   Transportation Needs:   . Freight forwarder (Medical):   [name] Lack of Transportation (Non-Medical):   Physical Activity:   .  Days of Exercise per Week:   . Minutes of Exercise per Session:   Stress:   . Feeling of Stress :   Social Connections:   . Frequency of Communication with Friends and Family:   . Frequency of Social Gatherings with Friends and Family:   . Attends Religious Services:   . Active Member of Clubs or Organizations:   . Attends Banker Meetings:   [name] Marital Status:   Intimate Partner Violence:   . Fear of Current or Ex-Partner:   . Emotionally Abused:   [name] Physically Abused:   . Sexually Abused:     Allergies:  Allergies  Allergen Reactions  . Sulfa Antibiotics Other (See Comments)    Medications: Prior to Admission medications   Medication Sig Start  Date End Date Taking? Authorizing Provider  phentermine (ADIPEX-P) 37.5 MG tablet Take 1 tablet (37.5 mg total) by mouth daily before breakfast. Patient not taking: Reported on 08/26/2019 07/23/19   Vena Austria, MD    Physical Exam Height 5\' 3"  (1.6 m), weight 288 lb 3.2 oz (130.7 kg), last menstrual period 08/03/2019. Wt Readings from Last 3 Encounters:  08/26/19 288 lb 3.2 oz (130.7 kg)  07/23/19 287 lb (130.2 kg)  04/17/19 298 lb (135.2 kg)  Body mass index is 51.05 kg/m.  No physical exam as this was a remote telephone visit to promote social distancing during the current COVID-19 Pandemic   Assessment: 29 y.o. 26 follow up obesity Plan: Problem List Items Addressed This Visit    None    Visit Diagnoses    Class 3 severe obesity without serious comorbidity with body mass index (BMI) of 50.0 to 59.9 in adult, unspecified obesity type (HCC)    -  Primary   Relevant Medications   phentermine (ADIPEX-P) 37.5 MG tablet      1) 1500 Calorie ADA Diet  2) Patient education given regarding appropriate lifestyle changes for weight loss including: regular physical activity, healthy coping strategies, caloric restriction and healthy eating patterns.  3) Patient to take medication, with the benefits of appetite suppression and metabolism boost d/w pt, along with the side effects and risk factors of long term use that will be avoided with our use of short bursts of therapy. Rx provided.    4) Telephone 8:24min  5)  Return in about 4 weeks (around 09/23/2019) for medication follow up.    11/23/2019, MD, [facility] Austria [facility], [facility] 08/26/2019, 8:38 AM

## 2019-09-23 ENCOUNTER — Other Ambulatory Visit: Payer: Self-pay

## 2019-09-23 ENCOUNTER — Ambulatory Visit (INDEPENDENT_AMBULATORY_CARE_PROVIDER_SITE_OTHER): Payer: BC Managed Care – PPO | Admitting: Obstetrics and Gynecology

## 2019-09-23 ENCOUNTER — Encounter: Payer: Self-pay | Admitting: Obstetrics and Gynecology

## 2019-09-23 VITALS — Ht 63.5 in | Wt 287.0 lb

## 2019-09-23 DIAGNOSIS — F411 Generalized anxiety disorder: Secondary | ICD-10-CM

## 2019-09-23 MED ORDER — HYDROXYZINE HCL 25 MG PO TABS
25.0000 mg | ORAL_TABLET | Freq: Four times a day (QID) | ORAL | 2 refills | Status: DC | PRN
Start: 2019-09-23 — End: 2021-02-23

## 2019-09-23 MED ORDER — SERTRALINE HCL 50 MG PO TABS
50.0000 mg | ORAL_TABLET | Freq: Every day | ORAL | 2 refills | Status: DC
Start: 2019-09-23 — End: 2019-10-07

## 2019-09-23 NOTE — Progress Notes (Signed)
I connected with Tresa Endo A Musial on 09/23/19 at  9:10 AM EDT by telephone and verified that I am speaking with the correct person using two identifiers.   I discussed the limitations, risks, security and privacy concerns of performing an evaluation and management service by telephone and the availability of in person appointments. I also discussed with the patient that there may be a patient responsible charge related to this service. The patient expressed understanding and agreed to proceed.  The patient was at home I spoke with the patient from my workstation phone The names of people involved in this encounter were: Julien Girt , and Vena Austria   Obstetrics & Gynecology Office Visit   Chief Complaint:  Chief Complaint  Patient presents with  . Weight Check  . Depression    History of Present Illness: The patient is a 29 y.o. female presenting initial evaluation for symptoms of anxiety and depression.  The patient is currently taking nothing for the management of her symptoms.  She has had any recent situational stressors, boyfriend cheated on her and left her for another woman.  She has struggled with this as Alycia Rossetti was very close with him as well.  She reports symptoms of insomnia, irritability, social anxiety, agorophobia and feelings of guilt.  She denies anhedonia, feelings of worthlessness, suicidal ideation, homicidal ideation, auditory hallucinations and visual hallucinations. Symptoms have worsened since last visit.     The patient does have a pre-existing history of depression and anxiety.  She  does not a prior history of suicide attempts.  Previous treatment tied include Zoloft.  Patientis a 29 y.o. G65P1011 female, who presents for the evaluation of the desire to lose weight. She has lost 1 pounds in 1 months. The patient states the following symptoms since starting her weight loss therapy: appetite suppression, energy, and weight loss.  The patient also reports no other  ill effects. The patient specifically denies heart palpitations, anxiety, and insomnia.    Review of Systems: Review of Systems  Constitutional: Negative.   Genitourinary: Negative.   Psychiatric/Behavioral: Positive for depression. Negative for hallucinations, memory loss, substance abuse and suicidal ideas. The patient is nervous/anxious and has insomnia.    Past Medical History:  Past Medical History:  Diagnosis Date  . Calculus of kidney 2006  . Depression   . Migraine   . Obesity     Past Surgical History:  Past Surgical History:  Procedure Laterality Date  . TONSILLECTOMY AND ADENOIDECTOMY    . Tubes in ears    . WRIST SURGERY Bilateral     Gynecologic History: Patient's last menstrual period was 09/02/2019 (exact date).  Obstetric History: G2P1011  Family History:  Family History  Problem Relation Age of Onset  . Healthy Father   . Diabetes Mother   . Cancer Maternal Grandmother   . Heart disease Maternal Grandmother   . Hypertension Maternal Grandmother   . Rheum arthritis Paternal Grandmother   . Cancer Other        Colon, Descending, Malignant  . Cancer Other        Colon, Transverse, Malignant    Social History:  Social History   Socioeconomic History  . Marital status: Single    Spouse name: Not on file  . Number of children: 1  . Years of education: Trade Sch  . Highest education level: Not on file  Occupational History  . Occupation: Chartered certified accountant  Tobacco Use  . Smoking status: Never Smoker  .  Smokeless tobacco: Never Used  Vaping Use  . Vaping Use: Never used  Substance and Sexual Activity  . Alcohol use: Yes    Comment: occasional   . Drug use: No  . Sexual activity: Yes    Birth control/protection: I.U.D.  Other Topics Concern  . Not on file  Social History Narrative   Lives at home with son.   Right-handed.   1 cup caffeine per day.   Social Determinants of Health   Financial Resource Strain:   . Difficulty of Paying  Living Expenses:   Food Insecurity:   . Worried About Programme researcher, broadcasting/film/video in the Last Year:   . Barista in the Last Year:   Transportation Needs:   . Freight forwarder (Medical):   [name] Lack of Transportation (Non-Medical):   Physical Activity:   . Days of Exercise per Week:   . Minutes of Exercise per Session:   Stress:   . Feeling of Stress :   Social Connections:   . Frequency of Communication with Friends and Family:   . Frequency of Social Gatherings with Friends and Family:   . Attends Religious Services:   . Active Member of Clubs or Organizations:   . Attends Banker Meetings:   [name] Marital Status:   Intimate Partner Violence:   . Fear of Current or Ex-Partner:   . Emotionally Abused:   [name] Physically Abused:   . Sexually Abused:     Allergies:  Allergies  Allergen Reactions  . Sulfa Antibiotics Other (See Comments)    Medications: Prior to Admission medications   Medication Sig Start Date End Date Taking? Authorizing Provider  phentermine (ADIPEX-P) 37.5 MG tablet Take 1 tablet (37.5 mg total) by mouth daily before breakfast. 08/26/19  Yes Vena Austria, MD    Physical Exam Vitals:Height 5' 3.5" (1.613 m), weight 287 lb (130.2 kg), last menstrual period 09/02/2019.  Patient's last menstrual period was 09/02/2019 (exact date).  No physical exam as this was a remote telephone visit to promote social distancing during the current COVID-19 Pandemic  GAD 7 : Generalized Anxiety Score 09/23/2019  Nervous, Anxious, on Edge 3  Control/stop worrying 3  Worry too much - different things 3  Trouble relaxing 3  Restless 3  Easily annoyed or irritable 3  Afraid - awful might happen 3  Total GAD 7 Score 21  Anxiety Difficulty Extremely difficult    Depression screen PHQ 2/9 09/23/2019  Decreased Interest 1  Down, Depressed, Hopeless 0  PHQ - 2 Score 1  Altered sleeping 3  Tired, decreased energy 3  Change in appetite 1  Feeling bad or  failure about yourself  2  Trouble concentrating 3  Moving slowly or fidgety/restless 1  Suicidal thoughts 0  PHQ-9 Score 14  Difficult doing work/chores Somewhat difficult    Depression screen PHQ 2/9 09/23/2019  Decreased Interest 1  Down, Depressed, Hopeless 0  PHQ - 2 Score 1  Altered sleeping 3  Tired, decreased energy 3  Change in appetite 1  Feeling bad or failure about yourself  2  Trouble concentrating 3  Moving slowly or fidgety/restless 1  Suicidal thoughts 0  PHQ-9 Score 14  Difficult doing work/chores Somewhat difficult     Assessment: 29 y.o. G2P1011 generalized anxiety  Plan: Problem List Items Addressed This Visit    None    Visit Diagnoses    Generalized anxiety disorder    -  Primary   Relevant Medications  sertraline (ZOLOFT) 50 MG tablet   hydrOXYzine (ATARAX/VISTARIL) 25 MG tablet      1) Generalized/Anxiety - will start Zoloft was on previously as well as prn vistaril  2) Thyroid and B12 screen has not been obtained previously  3) Telephone Time 19:28min  4) Return in about 2 weeks (around 10/07/2019) for Medication follow phone.    Vena Austria, MD, [facility], [facility] 09/23/2019, 9:35 AM

## 2019-10-07 ENCOUNTER — Encounter: Payer: Self-pay | Admitting: Obstetrics and Gynecology

## 2019-10-07 ENCOUNTER — Ambulatory Visit (INDEPENDENT_AMBULATORY_CARE_PROVIDER_SITE_OTHER): Payer: BC Managed Care – PPO | Admitting: Obstetrics and Gynecology

## 2019-10-07 ENCOUNTER — Other Ambulatory Visit: Payer: Self-pay

## 2019-10-07 DIAGNOSIS — F419 Anxiety disorder, unspecified: Secondary | ICD-10-CM

## 2019-10-07 DIAGNOSIS — F329 Major depressive disorder, single episode, unspecified: Secondary | ICD-10-CM | POA: Diagnosis not present

## 2019-10-07 MED ORDER — SERTRALINE HCL 50 MG PO TABS: 75.0000 mg | ORAL_TABLET | Freq: Every day | ORAL | 2 refills | Status: DC

## 2019-10-07 NOTE — Progress Notes (Signed)
I connected with Christine Hooper on 10/13/19 at  4:30 PM EDT by telephone and verified that I am speaking with the correct person using two identifiers.   I discussed the limitations, risks, security and privacy concerns of performing an evaluation and management service by telephone and the availability of in person appointments. I also discussed with the patient that there may be a patient responsible charge related to this service. The patient expressed understanding and agreed to proceed.  The patient was at home I spoke with the patient from my workstation phone The names of people involved in this encounter were: Julien Girt , and Vena Austria   Obstetrics & Gynecology Office Visit   Chief Complaint:  Chief Complaint  Patient presents with  . Follow-up    History of Present Illness: The patient is a 29 y.o. female presenting follow up for symptoms of anxiety and depression.  The patient is currently taking Zoloft 50mg  for the management of her symptoms.  She has not had any new recent situational stressors.  She reports symptoms have improved significantly.  She denies risk taking behavior, irritability, social anxiety, agorophobia, feelings of guilt, feelings of worthlessness, suicidal ideation, homicidal ideation, auditory hallucinations and visual hallucinations. Symptoms have improved since last visit.     The patient does have a pre-existing history of depression and anxiety.  She  does not a prior history of suicide attempts.    Review of Systems: Review of systems negative unless noted in HPI  Past Medical History:  Past Medical History:  Diagnosis Date  . Calculus of kidney 2006  . Depression   . Migraine   . Obesity     Past Surgical History:  Past Surgical History:  Procedure Laterality Date  . TONSILLECTOMY AND ADENOIDECTOMY    . Tubes in ears    . WRIST SURGERY Bilateral     Gynecologic History: Patient's last menstrual period was  09/28/2019.  Obstetric History: G2P1011  Family History:  Family History  Problem Relation Age of Onset  . Healthy Father   . Diabetes Mother   . Cancer Maternal Grandmother   . Heart disease Maternal Grandmother   . Hypertension Maternal Grandmother   . Rheum arthritis Paternal Grandmother   . Cancer Other        Colon, Descending, Malignant  . Cancer Other        Colon, Transverse, Malignant    Social History:  Social History   Socioeconomic History  . Marital status: Single    Spouse name: Not on file  . Number of children: 1  . Years of education: Trade Sch  . Highest education level: Not on file  Occupational History  . Occupation: 09/30/2019  Tobacco Use  . Smoking status: Never Smoker  . Smokeless tobacco: Never Used  Vaping Use  . Vaping Use: Never used  Substance and Sexual Activity  . Alcohol use: Yes    Comment: occasional   . Drug use: No  . Sexual activity: Yes    Birth control/protection: I.U.D.  Other Topics Concern  . Not on file  Social History Narrative   Lives at home with son.   Right-handed.   1 cup caffeine per day.   Social Determinants of Health   Financial Resource Strain:   . Difficulty of Paying Living Expenses: Not on file  Food Insecurity:   . 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 Ran Out of  Food in the Last Year: Not on file  Transportation Needs:   . Lack of Transportation (Medical): Not on file  . Lack of Transportation (Non-Medical): Not on file  Physical Activity:   . Days of Exercise per Week: Not on file  . Minutes of Exercise per Session: Not on file  Stress:   . Feeling of Stress : Not on file  Social Connections:   . Frequency of Communication with Friends and Family: Not on file  . Frequency of Social Gatherings with Friends and Family: Not on file  . Attends Religious Services: Not on file  . Active Member of Clubs or Organizations: Not on file  . Attends Banker  Meetings: Not on file  . Marital Status: Not on file  Intimate Partner Violence:   . Fear of Current or Ex-Partner: Not on file  . Emotionally Abused: Not on file  . Physically Abused: Not on file  . Sexually Abused: Not on file    Allergies:  Allergies  Allergen Reactions  . Sulfa Antibiotics Other (See Comments)    Medications: Prior to Admission medications   Medication Sig Start Date End Date Taking? Authorizing Provider  hydrOXYzine (ATARAX/VISTARIL) 25 MG tablet Take 1 tablet (25 mg total) by mouth every 6 (six) hours as needed for itching. 09/23/19  Yes Vena Austria, MD  sertraline (ZOLOFT) 50 MG tablet Take 1 tablet (50 mg total) by mouth daily. 09/23/19 09/22/20 Yes Vena Austria, MD    Physical Exam Vitals: There were no vitals filed for this visit. Patient's last menstrual period was 09/28/2019.  No physical exam as this was a remote telephone visit to promote social distancing during the current COVID-19 Pandemic    GAD 7 : Generalized Anxiety Score 10/07/2019 09/23/2019  Nervous, Anxious, on Edge 2 3  Control/stop worrying 2 3  Worry too much - different things 1 3  Trouble relaxing 2 3  Restless 2 3  Easily annoyed or irritable 2 3  Afraid - awful might happen 0 3  Total GAD 7 Score 11 21  Anxiety Difficulty Not difficult at all Extremely difficult    Depression screen [facility] 2/9 10/07/2019 09/23/2019  Decreased Interest 1 1  Down, Depressed, Hopeless 1 0  PHQ - 2 Score 2 1  Altered sleeping 2 3  Tired, decreased energy 2 3  Change in appetite 1 1  Feeling bad or failure about yourself  0 2  Trouble concentrating 2 3  Moving slowly or fidgety/restless 0 1  Suicidal thoughts 0 0  PHQ-9 Score 9 14  Difficult doing work/chores Not difficult at all Somewhat difficult    Depression screen [facility] 2/9 10/07/2019 09/23/2019  Decreased Interest 1 1  Down, Depressed, Hopeless 1 0  PHQ - 2 Score 2 1  Altered sleeping 2 3  Tired, decreased energy 2 3  Change  in appetite 1 1  Feeling bad or failure about yourself  0 2  Trouble concentrating 2 3  Moving slowly or fidgety/restless 0 1  Suicidal thoughts 0 0  PHQ-9 Score 9 14  Difficult doing work/chores Not difficult at all Somewhat difficult     Assessment: 29 y.o. G2P1011 medication follow up anxiety and depression  Plan: Problem List Items Addressed This Visit    None    Visit Diagnoses    Anxiety and depression    -  Primary   Relevant Medications   sertraline (ZOLOFT) 50 MG tablet      1) Anxiety/Depression - good  response but still scaling a little high.  Increase Zoloft to 75mg  po daily  2) Thyroid and B12 screen has been obtained previously  3) Telephone time 6:3min  4) Return in about 6 weeks (around 11/18/2019) for medication follow up.    01/18/2020, MD, [facility], [facility] 10/07/2019, 6:04 PM

## 2019-12-28 ENCOUNTER — Telehealth: Payer: Self-pay

## 2019-12-28 ENCOUNTER — Other Ambulatory Visit: Payer: Self-pay | Admitting: Obstetrics and Gynecology

## 2019-12-28 MED ORDER — MEDROXYPROGESTERONE ACETATE 10 MG PO TABS
10.0000 mg | ORAL_TABLET | Freq: Every day | ORAL | 0 refills | Status: DC
Start: 2019-12-28 — End: 2021-02-23

## 2019-12-28 NOTE — Telephone Encounter (Signed)
Patient reports she is 10 days late for period. Inquiring who to see/what next steps should be. GB#[phone]

## 2019-12-28 NOTE — Telephone Encounter (Signed)
Patient aware.

## 2019-12-28 NOTE — Telephone Encounter (Signed)
Spoke w/patient. She has had 4 negative pregnancy tests.

## 2019-12-28 NOTE — Telephone Encounter (Signed)
10 day course of provera called in.  Should start a period.  If she is pregnant then she won't get a period.  Period should start sometime the week after she finishes the provera

## 2020-12-29 ENCOUNTER — Ambulatory Visit (INDEPENDENT_AMBULATORY_CARE_PROVIDER_SITE_OTHER): Payer: BC Managed Care – PPO | Admitting: Obstetrics and Gynecology

## 2020-12-29 ENCOUNTER — Other Ambulatory Visit: Payer: Self-pay

## 2020-12-29 ENCOUNTER — Encounter: Payer: Self-pay | Admitting: Obstetrics and Gynecology

## 2020-12-29 ENCOUNTER — Other Ambulatory Visit (HOSPITAL_COMMUNITY)
Admission: RE | Admit: 2020-12-29 | Discharge: 2020-12-29 | Disposition: A | Discharge status: Home / Self Care | Payer: BC Managed Care – PPO | Source: Ambulatory Visit | Attending: Obstetrics and Gynecology | Admitting: Obstetrics and Gynecology

## 2020-12-29 VITALS — BP 121/76 | HR 88 | Wt 278.0 lb

## 2020-12-29 DIAGNOSIS — Z369 Encounter for antenatal screening, unspecified: Secondary | ICD-10-CM | POA: Insufficient documentation

## 2020-12-29 DIAGNOSIS — E669 Obesity, unspecified: Secondary | ICD-10-CM | POA: Diagnosis not present

## 2020-12-29 DIAGNOSIS — Z3A08 8 weeks gestation of pregnancy: Secondary | ICD-10-CM | POA: Diagnosis not present

## 2020-12-29 DIAGNOSIS — N926 Irregular menstruation, unspecified: Secondary | ICD-10-CM | POA: Diagnosis not present

## 2020-12-29 DIAGNOSIS — Z3201 Encounter for pregnancy test, result positive: Secondary | ICD-10-CM | POA: Diagnosis not present

## 2020-12-29 DIAGNOSIS — Z3689 Encounter for other specified antenatal screening: Secondary | ICD-10-CM

## 2020-12-29 DIAGNOSIS — O99212 Obesity complicating pregnancy, second trimester: Secondary | ICD-10-CM | POA: Diagnosis not present

## 2020-12-29 DIAGNOSIS — O99211 Obesity complicating pregnancy, first trimester: Secondary | ICD-10-CM

## 2020-12-29 DIAGNOSIS — Z3480 Encounter for supervision of other normal pregnancy, unspecified trimester: Secondary | ICD-10-CM | POA: Insufficient documentation

## 2020-12-29 DIAGNOSIS — O99210 Obesity complicating pregnancy, unspecified trimester: Secondary | ICD-10-CM

## 2020-12-29 DIAGNOSIS — Z3A21 21 weeks gestation of pregnancy: Secondary | ICD-10-CM | POA: Diagnosis not present

## 2020-12-29 DIAGNOSIS — Z113 Encounter for screening for infections with a predominantly sexual mode of transmission: Secondary | ICD-10-CM | POA: Diagnosis not present

## 2020-12-29 LAB — POCT URINE PREGNANCY: Preg Test, Ur: POSITIVE — AB

## 2020-12-29 MED ORDER — DOXYLAMINE-PYRIDOXINE 10-10 MG PO TBEC: DELAYED_RELEASE_TABLET | ORAL | 2 refills | Status: AC

## 2020-12-29 MED ORDER — ONDANSETRON 4 MG PO TBDP: 4.0000 mg | ORAL_TABLET | Freq: Four times a day (QID) | ORAL | 0 refills | Status: AC | PRN

## 2020-12-29 NOTE — Progress Notes (Signed)
New Obstetric Patient H&P    Chief Complaint: "Desires prenatal care"   History of Present Illness: Patient is a 30 y.o. G3P1011 Not Hispanic or Latino female, presents with amenorrhea and positive home pregnancy test. Patient's last menstrual period was 11/03/2020 (approximate). and based on her  LMP, her EDD is Estimated Date of Delivery: 08/10/21 and her EGA is [redacted]w[redacted]d. Her last pap smear was 07/23/2019 and was  normal .   Since her LMP she claims she has experienced nausea, fatigue. She denies vaginal bleeding. Her past medical history is noncontributory. Her prior pregnancies are notable for none  Since her LMP, she admits to the use of tobacco products  no There are cats in the home in the home  no She admits close contact with children on a regular basis  yes  She has had chicken pox in the past unknown She has had Tuberculosis exposures, symptoms, or previously tested positive for TB   no Current or past history of domestic violence. no  Genetic Screening/Teratology Counseling: (Includes patient, baby's father, or anyone in either family with:)   1. Patient's age >/= 3 at [facility]  no 2. Thalassemia ([place], [place], Mediterranean, or Asian background): MCV<80  no 3. Neural tube defect (meningomyelocele, spina bifida, anencephaly)  no 4. Congenital heart defect  no  5. Down syndrome  no 6. Tay-Sachs (Jewish, [place]  no 7. Canavan's Disease  no 8. Sickle cell disease or trait (African)  no  9. Hemophilia or other blood disorders  no  10. Muscular dystrophy  no  11. Cystic fibrosis  no  12. Huntington's Chorea  no  13. Mental retardation/autism  no 14. Other inherited genetic or chromosomal disorder  no 15. Maternal metabolic disorder (DM, PKU, etc)  no 16. Patient or FOB with a child with a birth defect not listed above no  16a. Patient or FOB with a birth defect themselves no 17. Recurrent pregnancy loss, or stillbirth  no  18. Any medications since LMP other than  prenatal vitamins (include vitamins, supplements, OTC meds, drugs, alcohol)  no 19. Any other genetic/environmental exposure to discuss  no  Infection History:   1. Lives with someone with TB or TB exposed  no  2. Patient or partner has history of genital herpes  no 3. Rash or viral illness since LMP  no 4. History of STI (GC, CT, HPV, syphilis, HIV)  no 5. History of recent travel :  no  Other pertinent information:  no     Review of Systems:10 point review of systems negative unless otherwise noted in HPI  Past Medical History:  Patient Active Problem List   Diagnosis Date Noted   Chronic migraine 06/14/2015   Spell of abnormal behavior    Left-sided weakness 06/09/2015    Past Surgical History:  Past Surgical History:  Procedure Laterality Date   TONSILLECTOMY AND ADENOIDECTOMY     Tubes in ears     WRIST SURGERY Bilateral     Gynecologic History: Patient's last menstrual period was 11/03/2020 (approximate).  Obstetric History: G3P1011  Family History:  Family History  Problem Relation Age of Onset   Healthy Father    Diabetes Mother    Cancer Maternal Grandmother    Heart disease Maternal Grandmother    Hypertension Maternal Grandmother    Rheum arthritis Paternal Grandmother    Cancer Other        Colon, Descending, Malignant   Cancer Other  Colon, Transverse, Malignant    Social History:  Social History   Socioeconomic History   Marital status: Single    Spouse name: Not on file   Number of children: 1   Years of education: Trade Sch   Highest education level: Not on file  Occupational History   Occupation: Chartered certified accountant  Tobacco Use   Smoking status: Never   Smokeless tobacco: Never  Vaping Use   Vaping Use: Never used  Substance and Sexual Activity   Alcohol use: Yes    Comment: occasional    Drug use: No   Sexual activity: Yes    Birth control/protection: I.U.D.  Other Topics Concern   Not on file  Social History Narrative    Lives at home with son.   Right-handed.   1 cup caffeine per day.   Social Determinants of Health   Financial Resource Strain: Not on file  Food Insecurity: Not on file  Transportation Needs: Not on file  Physical Activity: Not on file  Stress: Not on file  Social Connections: Not on file  Intimate Partner Violence: Not on file    Allergies:  Allergies  Allergen Reactions   Sulfa Antibiotics Other (See Comments)    Medications: Prior to Admission medications   Medication Sig Start Date End Date Taking? Authorizing Provider  Doxylamine-Pyridoxine 10-10 MG TBEC 2 tablets po bedtime, 1 tablet po AM, 1 tablet po lunch 12/29/20  Yes Vena [place], MD  ondansetron (ZOFRAN ODT) 4 MG disintegrating tablet Take 1 tablet (4 mg total) by mouth every 6 (six) hours as needed for nausea. 12/29/20  Yes Vena [place], MD  hydrOXYzine (ATARAX/VISTARIL) 25 MG tablet Take 1 tablet (25 mg total) by mouth every 6 (six) hours as needed for itching. Patient not taking: Reported on 12/29/2020 09/23/19   Vena [place], MD  medroxyPROGESTERone (PROVERA) 10 MG tablet Take 1 tablet (10 mg total) by mouth daily. Use for ten days Patient not taking: Reported on 12/29/2020 12/28/19   Vena [place], MD  sertraline (ZOLOFT) 50 MG tablet Take 1.5 tablets (75 mg total) by mouth daily. 10/07/19 10/06/20  Vena Austria, MD    Physical Exam Vitals: Blood pressure 121/76, pulse 88, weight 278 lb (126.1 kg), last menstrual period 11/03/2020.  General: NAD HEENT: normocephalic, anicteric Thyroid: no enlargement, no palpable nodules Pulmonary: No increased work of breathing, CTAB Cardiovascular: RRR, distal pulses 2+ Abdomen: NABS, soft, non-tender, non-distended.  Umbilicus without lesions.  No hepatomegaly, splenomegaly or masses palpable. No evidence of hernia  Genitourinary:  External: Normal external female genitalia.  Normal urethral meatus, normal  Bartholin's and Skene's glands.    Vagina:  Normal vaginal mucosa, no evidence of prolapse.    Cervix: Grossly normal in appearance, no bleeding  Uterus:  Non-enlarged, mobile, normal contour.  No CMT  Adnexa: ovaries non-enlarged, no adnexal masses  Rectal: deferred Extremities: no edema, erythema, or tenderness Neurologic: Grossly intact Psychiatric: mood appropriate, affect full   Assessment: 30 y.o. G3P1011 at [redacted]w[redacted]d presenting to initiate prenatal care  Plan: 1) Avoid alcoholic beverages. 2) Patient encouraged not to smoke.  3) Discontinue the use of all non-medicinal drugs and chemicals.  4) Take prenatal vitamins daily.  5) Nutrition, food safety (fish, cheese advisories, and high nitrite foods) and exercise discussed. 6) Hospital and practice style discussed with cross coverage system.  7) Genetic Screening, such as with 1st Trimester Screening, cell free fetal DNA, AFP testing, and Ultrasound, as well as with amniocentesis and CVS as appropriate, is discussed  with patient. At the conclusion of today's visit patient requested genetic testing 8) Patient is asked about travel to areas at risk for the Zika virus, and counseled to avoid travel and exposure to mosquitoes or sexual partners who may have themselves been exposed to the virus. Testing is discussed, and will be ordered as appropriate.   Vena Austria, MD, [facility], [facility]

## 2020-12-30 LAB — RPR+RH+ABO+RUB AB+AB SCR+CB...
Antibody Screen: NEGATIVE
HIV Screen 4th Generation wRfx: NONREACTIVE
Hematocrit: 44.7 % (ref 34.0–46.6)
Hemoglobin: 15.2 g/dL (ref 11.1–15.9)
Hepatitis B Surface Ag: NEGATIVE
MCH: 31 pg (ref 26.6–33.0)
MCHC: 34 g/dL (ref 31.5–35.7)
MCV: 91 fL (ref 79–97)
Platelets: 254 10*3/uL (ref 150–450)
RBC: 4.9 x10E6/uL (ref 3.77–5.28)
RDW: 12.5 % (ref 11.7–15.4)
RPR Ser Ql: NONREACTIVE
Rh Factor: POSITIVE
Rubella Antibodies, IGG: 2.06 index (ref >0.99)
Varicella zoster IgG: 608 index (ref >165)
WBC: 10.5 10*3/uL (ref 3.4–10.8)

## 2020-12-30 LAB — HEPATITIS C ANTIBODY: Hep C Virus Ab: <0.1 s/co ratio (ref 0.0–0.9)

## 2020-12-30 LAB — COMPREHENSIVE METABOLIC PANEL
ALT: 14 IU/L (ref 0–32)
AST: 13 IU/L (ref 0–40)
Albumin/Globulin Ratio: 1.5 (ref 1.2–2.2)
Albumin: 4.1 g/dL (ref 3.8–4.8)
Alkaline Phosphatase: 61 IU/L (ref 44–121)
BUN/Creatinine Ratio: 21 (ref 9–23)
BUN: 15 mg/dL (ref 6–20)
Bilirubin Total: 0.4 mg/dL (ref 0.0–1.2)
CO2: 22 mmol/L (ref 20–29)
Calcium: 9.2 mg/dL (ref 8.7–10.2)
Chloride: 103 mmol/L (ref 96–106)
Creatinine, Ser: 0.73 mg/dL (ref 0.57–1.00)
Globulin, Total: 2.8 g/dL (ref 1.5–4.5)
Glucose: 80 mg/dL (ref 70–99)
Potassium: 4.1 mmol/L (ref 3.5–5.2)
Sodium: 140 mmol/L (ref 134–144)
Total Protein: 6.9 g/dL (ref 6.0–8.5)
eGFR: 113 mL/min/{1.73_m2} (ref >59)

## 2020-12-30 LAB — PROTEIN / CREATININE RATIO, URINE
Creatinine, Urine: 72.9 mg/dL
Protein, Ur: 5.6 mg/dL
Protein/Creat Ratio: 77 mg/g creat (ref 0–200)

## 2020-12-30 LAB — HEMOGLOBIN A1C
Est. average glucose Bld gHb Est-mCnc: 103 mg/dL
Hgb A1c MFr Bld: 5.2 % (ref 4.8–5.6)

## 2020-12-31 LAB — URINE CULTURE: Organism ID, Bacteria: NO GROWTH

## 2021-01-02 LAB — CERVICOVAGINAL ANCILLARY ONLY
Chlamydia: NEGATIVE
Comment: NEGATIVE
Comment: NORMAL
Neisseria Gonorrhea: NEGATIVE

## 2021-01-03 DIAGNOSIS — O99210 Obesity complicating pregnancy, unspecified trimester: Secondary | ICD-10-CM | POA: Insufficient documentation

## 2021-01-03 DIAGNOSIS — Z3480 Encounter for supervision of other normal pregnancy, unspecified trimester: Secondary | ICD-10-CM | POA: Insufficient documentation

## 2021-01-10 ENCOUNTER — Ambulatory Visit
Admission: RE | Admit: 2021-01-10 | Discharge: 2021-01-10 | Disposition: A | Discharge status: Home / Self Care | Payer: BC Managed Care – PPO | Source: Ambulatory Visit | Attending: Obstetrics and Gynecology | Admitting: Obstetrics and Gynecology

## 2021-01-10 ENCOUNTER — Other Ambulatory Visit: Payer: Self-pay | Admitting: Obstetrics and Gynecology

## 2021-01-10 ENCOUNTER — Other Ambulatory Visit: Payer: Self-pay

## 2021-01-10 ENCOUNTER — Ambulatory Visit (HOSPITAL_COMMUNITY): Admission: RE | Admit: 2021-01-10 | Payer: BC Managed Care – PPO | Source: Ambulatory Visit

## 2021-01-10 DIAGNOSIS — Z3689 Encounter for other specified antenatal screening: Secondary | ICD-10-CM

## 2021-01-10 DIAGNOSIS — Z3480 Encounter for supervision of other normal pregnancy, unspecified trimester: Secondary | ICD-10-CM | POA: Diagnosis not present

## 2021-01-13 NOTE — Telephone Encounter (Signed)
Patient desires pregnancy. Mirena not received.

## 2021-01-26 ENCOUNTER — Ambulatory Visit (INDEPENDENT_AMBULATORY_CARE_PROVIDER_SITE_OTHER): Payer: BC Managed Care – PPO | Admitting: Obstetrics and Gynecology

## 2021-01-26 ENCOUNTER — Other Ambulatory Visit: Payer: Self-pay

## 2021-01-26 VITALS — BP 134/84 | Wt 281.0 lb

## 2021-01-26 DIAGNOSIS — O99211 Obesity complicating pregnancy, first trimester: Secondary | ICD-10-CM | POA: Diagnosis not present

## 2021-01-26 DIAGNOSIS — Z363 Encounter for antenatal screening for malformations: Secondary | ICD-10-CM

## 2021-01-26 DIAGNOSIS — Z1379 Encounter for other screening for genetic and chromosomal anomalies: Secondary | ICD-10-CM

## 2021-01-26 DIAGNOSIS — E669 Obesity, unspecified: Secondary | ICD-10-CM | POA: Diagnosis not present

## 2021-01-26 DIAGNOSIS — O99210 Obesity complicating pregnancy, unspecified trimester: Secondary | ICD-10-CM

## 2021-01-26 DIAGNOSIS — Z31438 Encounter for other genetic testing of female for procreative management: Secondary | ICD-10-CM

## 2021-01-26 DIAGNOSIS — Z3480 Encounter for supervision of other normal pregnancy, unspecified trimester: Secondary | ICD-10-CM

## 2021-01-26 DIAGNOSIS — Z3A13 13 weeks gestation of pregnancy: Secondary | ICD-10-CM

## 2021-01-26 LAB — POCT URINALYSIS DIPSTICK OB
Glucose, UA: NEGATIVE
POC,PROTEIN,UA: NEGATIVE

## 2021-01-26 NOTE — Progress Notes (Signed)
ROB - no concerns. RM 2 

## 2021-01-31 NOTE — Progress Notes (Signed)
° ° °  Routine Prenatal Care Visit  Subjective  Christine Hooper is a 30 y.o. G3P1011 at [redacted]w[redacted]d being seen today for ongoing prenatal care.  She is currently monitored for the following issues for this high-risk pregnancy and has Left-sided weakness; Spell of abnormal behavior; Chronic migraine; Supervision of other normal pregnancy, antepartum; and Maternal obesity, antepartum on their problem list.  ----------------------------------------------------------------------------------- Patient reports no complaints.   Contractions: Not present. Vag. Bleeding: None.  Movement: Absent. Denies leaking of fluid.  ----------------------------------------------------------------------------------- The following portions of the patient's history were reviewed and updated as appropriate: allergies, current medications, past family history, past medical history, past social history, past surgical history and problem list. Problem list updated.   Objective  Blood pressure 134/84, weight 281 lb (127.5 kg), last menstrual period 11/03/2020. Pregravid weight 268 lb (121.6 kg) Total Weight Gain 13 lb (5.897 kg) Body mass index is 49 kg/m.  Urinalysis:      Fetal Status: Fetal Heart Rate (bpm): 160   Movement: Absent     General:  Alert, oriented and cooperative. Patient is in no acute distress.  Skin: Skin is warm and dry. No rash noted.   Cardiovascular: Normal heart rate noted  Respiratory: Normal respiratory effort, no problems with respiration noted  Abdomen: Soft, gravid, appropriate for gestational age. Pain/Pressure: Absent     Pelvic:  Cervical exam deferred        Extremities: Normal range of motion.     ental Status: Normal mood and affect. Normal behavior. Normal judgment and thought content.   US OB Comp Less 14 Wks  Result Date: 01/12/2021 CLINICAL DATA:  Dating. EXAM: OBSTETRIC <14 WK ULTRASOUND TECHNIQUE: Transabdominal ultrasound was performed for evaluation of the gestation as well as the  maternal uterus and adnexal regions. COMPARISON:  None. FINDINGS: Intrauterine gestational sac: Single Yolk sac:  Not Visualized. Embryo:  Visualized. Cardiac Activity: Visualized. Heart Rate: 166 bpm CRL:   40.3 mm   10 w 6 d                  [place] EDC: August 02, 2021 Subchorionic hemorrhage:  None visualized. Maternal uterus/adnexae: The right ovary measures 1.5 cm x 2.2 cm x 2.3 cm and is normal in appearance. The left ovary measures 2.4 cm x 1.3 cm x 3.0 cm and is normal in appearance. No pelvic free fluid is identified. IMPRESSION: Single, viable intrauterine pregnancy at approximately 10 weeks and 6 days gestation by ultrasound evaluation. Electronically Signed   By: Aram Candela M.D.   On: 01/12/2021 03:16     Assessment   30 y.o. [id number] at [redacted]w[redacted]d by  08/10/2021, by Last Menstrual Period presenting for routine prenatal visit  Plan    Gestational age appropriate obstetric precautions including but not limited to vaginal bleeding, contractions, leaking of fluid and fetal movement were reviewed in detail with the patient.    MaterniT21 and Ineritest today Dating scan S>D on scan 01/10/21 redated Anatomy scan ordered Start ASA Anesthesia consult order placed  Return in about 4 weeks (around 02/23/2021) for ROB.  Vena Austria, MD, [facility], [facility]

## 2021-02-10 LAB — MATERNIT 21 PLUS CORE, BLOOD
Fetal Fraction: 8
Result (T21): NEGATIVE
Trisomy 13 (Patau syndrome): NEGATIVE
Trisomy 18 (Edwards syndrome): NEGATIVE
Trisomy 21 (Down syndrome): NEGATIVE

## 2021-02-10 LAB — INHERITEST CORE(CF97,SMA,FRAX)

## 2021-02-13 ENCOUNTER — Encounter: Payer: Self-pay | Admitting: Obstetrics and Gynecology

## 2021-02-23 ENCOUNTER — Other Ambulatory Visit: Payer: Self-pay

## 2021-02-23 ENCOUNTER — Ambulatory Visit (INDEPENDENT_AMBULATORY_CARE_PROVIDER_SITE_OTHER): Payer: BC Managed Care – PPO | Admitting: Obstetrics and Gynecology

## 2021-02-23 VITALS — BP 135/87 | Wt 293.0 lb

## 2021-02-23 DIAGNOSIS — O99212 Obesity complicating pregnancy, second trimester: Secondary | ICD-10-CM | POA: Diagnosis not present

## 2021-02-23 DIAGNOSIS — E669 Obesity, unspecified: Secondary | ICD-10-CM | POA: Diagnosis not present

## 2021-02-23 DIAGNOSIS — Z3A17 17 weeks gestation of pregnancy: Secondary | ICD-10-CM | POA: Diagnosis not present

## 2021-02-23 DIAGNOSIS — O99210 Obesity complicating pregnancy, unspecified trimester: Secondary | ICD-10-CM

## 2021-02-23 DIAGNOSIS — Z3480 Encounter for supervision of other normal pregnancy, unspecified trimester: Secondary | ICD-10-CM

## 2021-02-23 MED ORDER — BREAST PUMP MISC: 0 refills | Status: AC

## 2021-02-23 NOTE — Progress Notes (Signed)
° ° °  Routine Prenatal Care Visit  Subjective  Christine Hooper is a 31 y.o. G3P1011 at [redacted]w[redacted]d being seen today for ongoing prenatal care.  She is currently monitored for the following issues for this high-risk pregnancy and has Left-sided weakness; Spell of abnormal behavior; Chronic migraine; Supervision of other normal pregnancy, antepartum; and Maternal obesity, antepartum on their problem list.  ----------------------------------------------------------------------------------- Patient reports no complaints.   Contractions: Not present. Vag. Bleeding: None.  Movement: Absent. Denies leaking of fluid.  ----------------------------------------------------------------------------------- The following portions of the patient's history were reviewed and updated as appropriate: allergies, current medications, past family history, past medical history, past social history, past surgical history and problem list. Problem list updated.   Objective  Blood pressure 135/87, weight 293 lb (132.9 kg), last menstrual period 11/03/2020. Pregravid weight 268 lb (121.6 kg) Total Weight Gain 25 lb (11.3 kg) Urinalysis:      Fetal Status: Fetal Heart Rate (bpm): 150   Movement: Absent     General:  Alert, oriented and cooperative. Patient is in no acute distress.  Skin: Skin is warm and dry. No rash noted.   Cardiovascular: Normal heart rate noted  Respiratory: Normal respiratory effort, no problems with respiration noted  Abdomen: Soft, gravid, appropriate for gestational age. Pain/Pressure: Absent     Pelvic:  Cervical exam deferred        Extremities: Normal range of motion.     ental Status: Normal mood and affect. Normal behavior. Normal judgment and thought content.     Assessment   31 y.o. G3P1011 at [redacted]w[redacted]d by  08/02/2021, by Ultrasound presenting for routine prenatal visit  Plan   pregnancy 3 Problems (from 12/29/20 to present)     Problem Noted Resolved   Supervision of other normal  pregnancy, antepartum 01/03/2021 by Malachy Mood, MD No   Overview Addendum 02/13/2021  9:53 AM by Malachy Mood, MD     Nursing Staff Provider  Office Location  [facility] Dating  11 week [place]  Language  English Anatomy US    Flu Vaccine   Genetic Screen  NIPS: Normal XY  TDaP vaccine    Hgb A1C or  GTT Early :HgbA1C 5.2 Third trimester :   Covid    LAB RESULTS   Rhogam  N/A Blood Type O/Positive/-- (11/17 1034)   Feeding Plan  Antibody Negative (11/17 1034)  Contraception  Rubella 2.06 (11/17 1034)  Circumcision  RPR Non Reactive (11/17 1034)   Pediatrician   HBsAg Negative (11/17 1034)   Support Person  HIV Non Reactive (11/17 1034)  Prenatal Classes  Varicella Immune    GBS  (For PCN allergy, check sensitivities)   BTL Consent     VBAC Consent  Pap 07/23/2019 NILM    Hgb Electro      CF negative     SMA Negative Fragile-X negative    HepC Negative        Maternal obesity, antepartum 01/03/2021 by Malachy Mood, MD No        Gestational age appropriate obstetric precautions including but not limited to vaginal bleeding, contractions, leaking of fluid and fetal movement were reviewed in detail with the patient.    Return in about 4 weeks (around 03/23/2021) for ROB.  Malachy Mood, MD, [facility], [facility] 02/23/2021, 3:52 PM

## 2021-02-23 NOTE — Progress Notes (Signed)
ROB - no concerns. RM 2 

## 2021-03-02 ENCOUNTER — Other Ambulatory Visit: Payer: Self-pay

## 2021-03-02 DIAGNOSIS — O99210 Obesity complicating pregnancy, unspecified trimester: Secondary | ICD-10-CM

## 2021-03-02 DIAGNOSIS — Z8669 Personal history of other diseases of the nervous system and sense organs: Secondary | ICD-10-CM

## 2021-03-02 DIAGNOSIS — R531 Weakness: Secondary | ICD-10-CM

## 2021-03-08 ENCOUNTER — Other Ambulatory Visit: Payer: BC Managed Care – PPO

## 2021-03-09 ENCOUNTER — Ambulatory Visit (HOSPITAL_BASED_OUTPATIENT_CLINIC_OR_DEPARTMENT_OTHER): Payer: BC Managed Care – PPO | Admitting: Obstetrics and Gynecology

## 2021-03-09 ENCOUNTER — Other Ambulatory Visit: Payer: Self-pay

## 2021-03-09 ENCOUNTER — Ambulatory Visit: Payer: BC Managed Care – PPO | Attending: Obstetrics and Gynecology

## 2021-03-09 VITALS — BP 131/84 | HR 88 | Temp 97.6°F | Ht 63.5 in | Wt 292.0 lb

## 2021-03-09 DIAGNOSIS — O99210 Obesity complicating pregnancy, unspecified trimester: Secondary | ICD-10-CM

## 2021-03-09 DIAGNOSIS — R531 Weakness: Secondary | ICD-10-CM | POA: Insufficient documentation

## 2021-03-09 DIAGNOSIS — Z3480 Encounter for supervision of other normal pregnancy, unspecified trimester: Secondary | ICD-10-CM

## 2021-03-09 DIAGNOSIS — O99212 Obesity complicating pregnancy, second trimester: Secondary | ICD-10-CM

## 2021-03-09 DIAGNOSIS — O26892 Other specified pregnancy related conditions, second trimester: Secondary | ICD-10-CM | POA: Diagnosis not present

## 2021-03-09 DIAGNOSIS — O99352 Diseases of the nervous system complicating pregnancy, second trimester: Secondary | ICD-10-CM | POA: Diagnosis not present

## 2021-03-09 DIAGNOSIS — Z3A18 18 weeks gestation of pregnancy: Secondary | ICD-10-CM | POA: Diagnosis not present

## 2021-03-09 DIAGNOSIS — Z3A19 19 weeks gestation of pregnancy: Secondary | ICD-10-CM | POA: Diagnosis not present

## 2021-03-09 DIAGNOSIS — G43909 Migraine, unspecified, not intractable, without status migrainosus: Secondary | ICD-10-CM | POA: Diagnosis not present

## 2021-03-09 DIAGNOSIS — E669 Obesity, unspecified: Secondary | ICD-10-CM

## 2021-03-09 DIAGNOSIS — Z8669 Personal history of other diseases of the nervous system and sense organs: Secondary | ICD-10-CM

## 2021-03-09 DIAGNOSIS — Z363 Encounter for antenatal screening for malformations: Secondary | ICD-10-CM | POA: Insufficient documentation

## 2021-03-09 NOTE — Progress Notes (Signed)
Maternal-Fetal Medicine  Name: Christine Hooper DOB: [birth year] MRN: [id number] Referring Provider: Vena Austria, MD   I had the pleasure of seeing [name] today at [facility], [facility].  She is G3 P1011 at 19w 1d gestation and is here for fetal anatomy scan and consultation.  Obstetrical history significant for a term vaginal delivery of a female infant weighing 2,977 grams at birth.  Her pregnancy and delivery were uncomplicated.  She did not have gestational diabetes or preeclampsia in a previous pregnancy. Patient does not have hypertension or diabetes or any other chronic medical conditions.  Her prenatal course has otherwise been uneventful.  Her pregnancy is well dated by 12-week ultrasound.  On cell free fetal DNA screening, the risks of fetal aneuploidies are not increased.  Blood pressure today at her office is 131/84 mmHg.Her prepregnancy BMI was 50. Her most recent hemoglobin A1C was 5.2%.  Ultrasound We performed fetal anatomical survey.  Amniotic fluid is normal and good fetal activity seen.  Fetal biometry is consistent with the previously established dates.  No markers of aneuploidies or fetal structural defects are seen.  As maternal obesity imposes limitations on ultrasound, fetal anomalies may be missed.  Our concerns include:  Maternal Class III obesity: -Obesity is associated with increased risk of miscarriages, fetal congenital malformations including open neural tube defects and stillbirth.  Although there is an increase in stillbirth rate the absolute risk is very small (0.8 to 1 per 1,000 pregnancies). -Ultrasound has limitations in detecting congenital malformations and fetal anomalies can be missed.  -Gestational hypertension and gestational diabetes or more common in women with class III obesity. Early GDM screening is recommended.  -Increase in nonalcoholic fatty liver disease has been reported.  Patient does not have underlying liver condition and we  should, therefore, expect good maternal outcomes.  -Obesity is associated with increased risk of venous thromboembolism.  If patient undergoes cesarean delivery or has prolonged hospitalization, prophylactic anticoagulation should be considered.  -Fetal macrosomia is also increased with maternal obesity.  -We do not recommend weight loss during pregnancy.  A weight gain of 11 to 20 pounds (5 to 9 kg) is recommended. -As stillbirth is slightly more common, we recommend weekly BPP from [redacted] weeks gestation till delivery [employer], Vol 137, No. 6, June 2021).  -Obesity can pose anesthetic challenges and I recommend anesthesiology consultation before delivery. -Early epidural analgesia in labor is recommended.  -Operative wound infections are more frequent.  Recommendations -Fetal growth assessments every 4 weeks till delivery.  Patient is planning to deliver at [facility] and will be having her ultrasound performed there. -Weekly BPP from [redacted] weeks gestation till delivery. -Early screening for gestational diabetes.  Thank you for consultation.  If you have any questions or concerns, please contact me the [facility].  Consultation including face-to-face counseling (more than 50% of time spent) is 30 minutes.

## 2021-03-13 ENCOUNTER — Telehealth: Payer: Self-pay

## 2021-03-13 NOTE — Telephone Encounter (Signed)
Pt calling; [redacted]w[redacted]d; is having a loty of lower abd pain; sharper at times.  [phone] pt states most of the pain is on the right side; sitting helps relieve it; adv could be round ligament pain; may take two extra strength tylenol every 6 hours while awake and apply heat 20 minutes each hour while awake.

## 2021-03-24 ENCOUNTER — Other Ambulatory Visit: Payer: Self-pay

## 2021-03-24 ENCOUNTER — Ambulatory Visit (INDEPENDENT_AMBULATORY_CARE_PROVIDER_SITE_OTHER): Payer: BC Managed Care – PPO | Admitting: Obstetrics & Gynecology

## 2021-03-24 VITALS — BP 122/80 | Wt 300.0 lb

## 2021-03-24 DIAGNOSIS — Z3A21 21 weeks gestation of pregnancy: Secondary | ICD-10-CM | POA: Diagnosis not present

## 2021-03-24 DIAGNOSIS — O99212 Obesity complicating pregnancy, second trimester: Secondary | ICD-10-CM | POA: Diagnosis not present

## 2021-03-24 DIAGNOSIS — E669 Obesity, unspecified: Secondary | ICD-10-CM | POA: Diagnosis not present

## 2021-03-24 DIAGNOSIS — Z131 Encounter for screening for diabetes mellitus: Secondary | ICD-10-CM

## 2021-03-24 DIAGNOSIS — Z3482 Encounter for supervision of other normal pregnancy, second trimester: Secondary | ICD-10-CM

## 2021-03-24 DIAGNOSIS — O99210 Obesity complicating pregnancy, unspecified trimester: Secondary | ICD-10-CM

## 2021-03-24 LAB — POCT URINALYSIS DIPSTICK OB
Glucose, UA: NEGATIVE
POC,PROTEIN,UA: NEGATIVE

## 2021-03-24 NOTE — Progress Notes (Signed)
Subjective  Fetal Movement? yes Contractions? no Leaking Fluid? no Vaginal Bleeding? no  Objective  BP 122/80    Wt 300 lb (136.1 kg)    LMP 11/03/2020 (Approximate)    BMI 52.31 kg/m  General: NAD Pumonary: no increased work of breathing Abdomen: gravid, non-tender Extremities: no edema Psychiatric: mood appropriate, affect full  Assessment  31 y.o. G3P1011 at [redacted]w[redacted]d by  08/02/2021, by Ultrasound presenting for routine prenatal visit  Plan   Problem List Items Addressed This Visit      Other   Supervision of other normal pregnancy, antepartum - Primary   Maternal obesity, antepartum  Other Visit Diagnoses    [redacted] weeks gestation of pregnancy       Screening for diabetes mellitus       Relevant Orders   28 Week RH+Panel     pregnancy 3 Problems (from 12/29/20 to present)    Problem Noted Resolved   Supervision of other normal pregnancy, antepartum 01/03/2021 by Vena Austria, MD No   Overview Addendum 02/13/2021  9:53 AM by Vena Austria, MD     Nursing Staff Provider  Office Location  [facility] Dating  11 week [place]  Language  English Anatomy US  MFM nml  Flu Vaccine   Genetic Screen  NIPS: Normal XY  TDaP vaccine    Hgb A1C or  GTT Early :HgbA1C 5.2 Third trimester :   Covid    LAB RESULTS   Rhogam  N/A Blood Type O/Positive/-- (11/17 1034)   Feeding Plan  Antibody Negative (11/17 1034)  Contraception  Rubella 2.06 (11/17 1034)  Circumcision  RPR Non Reactive (11/17 1034)   Pediatrician   HBsAg Negative (11/17 1034)   Support Person  HIV Non Reactive (11/17 1034)  Prenatal Classes  Varicella Immune    GBS  (For PCN allergy, check sensitivities)   BTL Consent     VBAC Consent  Pap 07/23/2019 NILM    Hgb Electro      CF negative     SMA Negative Fragile-X negative    HepC Negative         Maternal obesity, antepartum 01/03/2021 by Vena Austria, MD No   Overview Signed 03/24/2021  8:19 AM by Nadara Mustard, MD    BMI >=40 [x ] early 1h gtt -  HgbA1C nml [ ]  screen sleep apnea [x ] anesthesia consult (early and late if BMI > 45) [x ] u/s for dating [x ]  [x ] nutritional goals [x ] folic acid 1mg  [x ] bASA (>12 weeks) [ ]  consider nutrition consult [ ]  consider maternal EKG 1st trimester [x ] Growth u/s 28 [ ] , 32 [ ] , 36 weeks [ ]  [x ] NST/AFI weekly 34+ weeks (34[] ,35[] ,36[] , 37[] , 38[] , 39[] , 40[] ) [x ] IOL by 41 weeks (scheduled, prn [] )        The following were addressed during this visit:  Breastfeeding Education - Early initiation of breastfeeding  - The importance of exclusive breastfeeding  - Risks of giving your baby anything other than breast milk if you are breastfeeding  - Nonpharmacological pain relief methods for labor  - The importance of early skin-to-skin contact  - Rooming-in on a 24-hour basis  - Feeding on demand or baby-led feeding  - Frequent feeding to help assure optimal milk production  - Effective positioning and attachment  - Exclusive breastfeeding for the first 6 months  - Individualized Education   18-21 weeks - Ultrasound    Review of ULTRASOUND.  I have personally reviewed images and report of recent ultrasound done at [facility]. There is a singleton gestation with subjectively normal amniotic fluid volume. The fetal biometry correlates with established dating. Detailed evaluation of the fetal anatomy was performed.The fetal anatomical survey appears within normal limits within the resolution of ultrasound as described above.  It must be noted that a normal ultrasound is unable to rule out fetal aneuploidy.    Discussed PNV daily  Annamarie Major, MD, [facility], [facility] 03/24/2021  8:43 AM

## 2021-03-24 NOTE — Patient Instructions (Signed)

## 2021-04-06 ENCOUNTER — Ambulatory Visit: Payer: BC Managed Care – PPO

## 2021-04-07 ENCOUNTER — Telehealth: Payer: Self-pay

## 2021-04-07 NOTE — Telephone Encounter (Signed)
Patient was last seen with [facility] on 03/24/21 with no follow up scheduled. I counted patient to see if we could scheduled and follow up visit. Patient reports she is transfer care to [facility]. No longer needs to scheduled.

## 2021-04-25 ENCOUNTER — Other Ambulatory Visit: Payer: BC Managed Care – PPO

## 2022-04-19 ENCOUNTER — Ambulatory Visit
Admission: RE | Admit: 2022-04-19 | Discharge: 2022-04-19 | Disposition: A | Discharge status: Home / Self Care | Payer: BC Managed Care – PPO | Source: Ambulatory Visit | Attending: Internal Medicine | Admitting: Internal Medicine

## 2022-04-19 VITALS — BP 146/90 | HR 82 | Temp 97.9°F | Resp 16

## 2022-04-19 DIAGNOSIS — J069 Acute upper respiratory infection, unspecified: Secondary | ICD-10-CM | POA: Diagnosis not present

## 2022-04-19 DIAGNOSIS — R051 Acute cough: Secondary | ICD-10-CM

## 2022-04-19 DIAGNOSIS — M542 Cervicalgia: Secondary | ICD-10-CM | POA: Diagnosis not present

## 2022-04-19 MED ORDER — AMOXICILLIN-POT CLAVULANATE 875-125 MG PO TABS: 1.0000 | ORAL_TABLET | Freq: Two times a day (BID) | ORAL | 0 refills | Status: AC

## 2022-04-19 NOTE — ED Triage Notes (Signed)
Pt states congestion and sore throat  for over a week, states this am she woke up with left neck pain worse with movement. Pt states she has been taking dayquil and nyquil at home.

## 2022-04-19 NOTE — ED Provider Notes (Addendum)
[name]    CSN: [id number] Arrival date & time: 04/19/22  1019      History   Chief Complaint Chief Complaint  Patient presents with   Sore Throat    Congestion, sore throat, and neck pain - Entered by patient    HPI Christine Hooper is a 32 y.o. female.   Patient presents with a little over a week history of nasal congestion, sore throat, cough.  Patient reports that she has been taking NyQuil with minimal improvement of symptoms.  Patient reports that she became concerned yesterday given that she started having some left-sided neck pain that has continued to worsen.  She denies any injury to the neck or any history of chronic neck pain.  Has not taken any medication for neck pain.  Denies chest pain, shortness of breath, gastrointestinal symptoms.  Denies any associated fever.  Reports her son has had similar symptoms.  Denies history of asthma.   Sore Throat    Past Medical History:  Diagnosis Date   Calculus of kidney 2006   Depression    Migraine    Obesity     Patient Active Problem List   Diagnosis Date Noted   Supervision of other normal pregnancy, antepartum 01/03/2021   Maternal obesity, antepartum 01/03/2021   Chronic migraine 06/14/2015   Spell of abnormal behavior    Left-sided weakness 06/09/2015    Past Surgical History:  Procedure Laterality Date   TONSILLECTOMY AND ADENOIDECTOMY     Tubes in ears     WRIST SURGERY Bilateral     OB History     Gravida  3   Para  1   Term  1   Preterm      AB  1   Living  1      SAB  1   IAB      Ectopic      Multiple      Live Births  1            Home Medications    Prior to Admission medications   Medication Sig Start Date End Date Taking? Authorizing Provider  amoxicillin-clavulanate (AUGMENTIN) 875-125 MG tablet Take 1 tablet by mouth every 12 (twelve) hours. 04/19/22  Yes Teodora Medici, FNP  Doxylamine-Pyridoxine 10-10 MG TBEC 2 tablets po bedtime, 1 tablet po AM,  1 tablet po lunch 12/29/20   Malachy Mood, MD  Misc. Devices (BREAST PUMP) MISC Dispense one breast pump for patient 02/23/21   Malachy Mood, MD  ondansetron (ZOFRAN ODT) 4 MG disintegrating tablet Take 1 tablet (4 mg total) by mouth every 6 (six) hours as needed for nausea. Patient not taking: Reported on 03/24/2021 12/29/20   Malachy Mood, MD    Family History Family History  Problem Relation Age of Onset   Healthy Father    Diabetes Mother    Cancer Maternal Grandmother    Heart disease Maternal Grandmother    Hypertension Maternal Grandmother    Rheum arthritis Paternal Grandmother    Cancer Other        Colon, Descending, Malignant   Cancer Other        Colon, Transverse, Malignant    Social History Social History   Tobacco Use   Smoking status: Never   Smokeless tobacco: Never  Vaping Use   Vaping Use: Never used  Substance Use Topics   Alcohol use: Yes    Comment: occasional    Drug use: No  Allergies   Sulfa antibiotics   Review of Systems Review of Systems Per HPI  Physical Exam Triage Vital Signs ED Triage Vitals  Enc Vitals Group     BP 04/19/22 1036 (!) 146/90     Pulse Rate 04/19/22 1036 82     Resp 04/19/22 1036 16     Temp 04/19/22 1036 97.9 F (36.6 C)     Temp Source 04/19/22 1036 Oral     SpO2 04/19/22 1036 99 %     Weight --      Height --      Head Circumference --      Peak Flow --      Pain Score 04/19/22 1037 6     Pain Loc --      Pain Edu? --      Excl. in [place]? --    No data found.  Updated Vital Signs BP (!) 146/90 (BP Location: Left Arm)   Pulse 82   Temp 97.9 F (36.6 C) (Oral)   Resp 16   SpO2 99%   Breastfeeding No   Visual Acuity Right Eye Distance:   Left Eye Distance:   Bilateral Distance:    Right Eye Near:   Left Eye Near:    Bilateral Near:     Physical Exam Constitutional:      General: She is not in acute distress.    Appearance: Normal appearance. She is not toxic-appearing or  diaphoretic.  HENT:     Head: Normocephalic and atraumatic.     Right Ear: Tympanic membrane and ear canal normal.     Left Ear: Tympanic membrane and ear canal normal.     Nose: Congestion present.     Mouth/Throat:     Mouth: Mucous membranes are moist.     Pharynx: Posterior oropharyngeal erythema present.  Eyes:     Extraocular Movements: Extraocular movements intact.     Conjunctiva/sclera: Conjunctivae normal.     Pupils: Pupils are equal, round, and reactive to light.  Neck:      Comments: Mild tenderness to palpation to left lateral neck.  No obvious swelling, discoloration noted.  No direct spinal tenderness, crepitus, step-off noted. Cardiovascular:     Rate and Rhythm: Normal rate and regular rhythm.     Pulses: Normal pulses.     Heart sounds: Normal heart sounds.  Pulmonary:     Effort: Pulmonary effort is normal. No respiratory distress.     Breath sounds: Normal breath sounds. No stridor. No wheezing, rhonchi or rales.  Abdominal:     General: Abdomen is flat. Bowel sounds are normal.     Palpations: Abdomen is soft.  Musculoskeletal:        General: Normal range of motion.     Cervical back: Normal range of motion. No rigidity. Pain with movement and muscular tenderness present. No spinous process tenderness.  Lymphadenopathy:     Head:     Right side of head: No occipital adenopathy.     Left side of head: No occipital adenopathy.     Cervical: Cervical adenopathy present.     Right cervical: Superficial cervical adenopathy present.     Left cervical: Superficial cervical adenopathy present.  Skin:    General: Skin is warm and dry.  Neurological:     General: No focal deficit present.     Mental Status: She is alert and oriented to person, place, and time. Mental status is at baseline.  Psychiatric:  Mood and Affect: Mood normal.        Behavior: Behavior normal.      UC Treatments / Results  Labs (all labs ordered are listed, but only abnormal  results are displayed) Labs Reviewed - No data to display  EKG   Radiology No results found.  Procedures Procedures (including critical care time)  Medications Ordered in UC Medications - No data to display  Initial Impression / Assessment and Plan / UC Course  I have reviewed the triage vital signs and the nursing notes.  Pertinent labs & imaging results that were available during my care of the patient were reviewed by me and considered in my medical decision making (see chart for details).     Patient's symptoms most likely started off as a viral illness but given duration of symptoms and symptoms being refractory to over-the-counter medications, will treat with Augmentin antibiotic to cover for secondary bacterial infection.  No concern for strep throat on exam at this time.  No adventitious lung sounds on exam so do not think that chest imaging is necessary.  Patient reporting neck pain.  Differential diagnoses for neck pain include lymph node swelling versus neck muscle strain.  No obvious lymph node swelling in the area of pain but patient does have cervical lymph node swelling that is very mild so suspect possible lymph node inflammation in the area of pain as well.  Although, could be neck muscle strain given that movement exacerbates pain and area of pain as well. Advised supportive care related to both of these etiologies and safe OTC pain relievers. No concern for meningitis or any worrisome etiology at this time given physical exam and no associated fever. Although, did advise strict ER and return precautions.  Advised supportive care.  Patient verbalized understanding and was agreeable with plan. Final Clinical Impressions(s) / UC Diagnoses   Final diagnoses:  Acute upper respiratory infection  Acute cough  Neck pain     Discharge Instructions      I have prescribed an antibiotic for upper respiratory infection.  Follow-up if any symptoms persist or worsen.    ED  Prescriptions     Medication Sig Dispense Auth. Provider   amoxicillin-clavulanate (AUGMENTIN) 875-125 MG tablet Take 1 tablet by mouth every 12 (twelve) hours. 14 tablet [place], Michele Rockers, [place]      PDMP not reviewed this encounter.   Teodora Medici, [place] 04/19/22 [place], [place],  04/19/22 [phone]

## 2022-04-19 NOTE — Discharge Instructions (Signed)
I have prescribed an antibiotic for upper respiratory infection.  Follow-up if any symptoms persist or worsen.
